# Patient Record
Sex: Female | Born: 1955
Health system: Southern US, Community
[De-identification: ages and names within clinical notes are randomized; demographics above are authoritative.]

---

## 1998-10-08 ENCOUNTER — Other Ambulatory Visit: Admission: RE | Admit: 1998-10-08 | Discharge: 1998-10-08 | Payer: Self-pay | Admitting: *Deleted

## 2000-11-23 ENCOUNTER — Other Ambulatory Visit: Admission: RE | Admit: 2000-11-23 | Discharge: 2000-11-23 | Payer: Self-pay | Admitting: *Deleted

## 2002-08-28 ENCOUNTER — Other Ambulatory Visit: Admission: RE | Admit: 2002-08-28 | Discharge: 2002-08-28 | Payer: Self-pay | Admitting: *Deleted

## 2002-11-27 ENCOUNTER — Other Ambulatory Visit: Admission: RE | Admit: 2002-11-27 | Discharge: 2002-11-27 | Payer: Self-pay | Admitting: *Deleted

## 2004-02-02 ENCOUNTER — Other Ambulatory Visit: Admission: RE | Admit: 2004-02-02 | Discharge: 2004-02-02 | Payer: Self-pay | Admitting: *Deleted

## 2005-10-24 ENCOUNTER — Other Ambulatory Visit: Admission: RE | Admit: 2005-10-24 | Discharge: 2005-10-24 | Payer: Self-pay | Admitting: *Deleted

## 2006-12-12 ENCOUNTER — Other Ambulatory Visit: Admission: RE | Admit: 2006-12-12 | Discharge: 2006-12-12 | Payer: Self-pay | Admitting: *Deleted

## 2008-03-24 ENCOUNTER — Other Ambulatory Visit: Admission: RE | Admit: 2008-03-24 | Discharge: 2008-03-24 | Payer: Self-pay | Admitting: Gynecology

## 2008-07-17 ENCOUNTER — Ambulatory Visit: Payer: Self-pay | Admitting: Gastroenterology

## 2008-07-31 ENCOUNTER — Ambulatory Visit: Payer: Self-pay | Admitting: Gastroenterology

## 2011-04-24 ENCOUNTER — Ambulatory Visit: Payer: Self-pay | Admitting: Internal Medicine

## 2011-05-03 ENCOUNTER — Encounter: Payer: Self-pay | Admitting: Internal Medicine

## 2011-05-03 ENCOUNTER — Ambulatory Visit (INDEPENDENT_AMBULATORY_CARE_PROVIDER_SITE_OTHER): Payer: BC Managed Care – PPO | Admitting: Internal Medicine

## 2011-05-03 VITALS — BP 118/78 | HR 52 | Temp 97.5°F | Resp 16 | Ht 68.0 in | Wt 118.4 lb

## 2011-05-03 DIAGNOSIS — Z0000 Encounter for general adult medical examination without abnormal findings: Secondary | ICD-10-CM

## 2011-05-03 NOTE — Assessment & Plan Note (Addendum)
Exam is normal, labs are not available to me today to review but she will have them forwarded to me, form completed for her insurance premium

## 2011-05-03 NOTE — Patient Instructions (Signed)
Osteoporosis Osteoporosis is a disease of the bones that makes them weaker and prone to break (fracture). By their mid-30s, most people begin to gradually lose bone strength. If this is severe enough, osteoporosis may occur. Osteopenia is a less severe weakness of the bones, which places you at risk for osteoporosis. It is important to identify if you have osteoporosis or osteopenia. Bone fractures from osteoporosis (especially hip and spine fractures) are a major cause of hospitalization, loss of independence, and can lead to life-threatening complications. CAUSES There are a number of causes and risk factors:  Gender. Women are at a higher risk for osteoporosis than men.   Age. Bone formation slows down with age.   Ethnicity. For unclear reasons, white and Asian women are at higher risk for osteoporosis. Hispanic and African American women are at increased, but lesser, risk.   Family history of osteoporosis can mean that you are at a higher risk for getting it.   History of bone fractures indicates you may be at higher risk of another.   Calcium is very important for bone health and strength. Not enough calcium in your diet increases your risk for osteoporosis. Vitamin D is important for calcium metabolism. You get vitamin D from sunlight, foods, or supplements.   Physical activity. Bones get stronger with weight-bearing exercise and weaker without use.   Smoking is associated with decreased bone strength.   Medicines. Cortisone medicines, too much thyroid medicine, some cancer and seizure medicines, and others can weaken bones and cause osteoporosis.   Decreased body weight is associated with osteoporosis. The small amount of estrogen-type molecules produced in fat cells seems to protect the bones.   Menopausal decrease in the hormone estrogen can cause osteoporosis.   Low levels of the hormone testosterone can cause osteoporosis.   Some medical conditions can lead to osteoporosis  (hyperthyroidism, hyperparathyroidism, B12 deficiency).  SYMPTOMS Usually, no symptoms are felt as the bones weaken. The first symptoms are generally related to bone fractures. You may have silent, tiny bone fractures, especially in your spine. This can cause height loss and forward bending of the spine (kyphosis). DIAGNOSIS You or your caregiver may suspect osteoporosis based on height loss and kyphosis. Osteoporosis or osteopenia may be identified on an X-ray done for other reasons. A bone density measurement will likely be taken. Your bones are often measured at your lower spine or your hips. Measurement is done by an X-ray called a DEXA scan, or sometimes by a computerized X-ray scan (CT or CAT scan). Other tests may be done to find the cause of osteoporosis, such as blood tests to measure calcium and vitamin D, or to monitor treatment. TREATMENT The goal of osteoporosis treatment is to prevent fractures. This is done through medicine and home care treatments. Treatment will slow the weakening of your bones and strengthen them where possible. Measures to decrease the likelihood of falling and fracturing a bone are also important. Medicine  You may need supplements if you are not getting enough calcium, vitamin D, and vitamin B12.   If you are female and menopausal, you should discuss the option of estrogen replacement or estrogen-like medicine with your caregiver.   Medicines can be taken by mouth or injection to help build bone strength. When taken by mouth, there are important directions that you need to follow.   Calcitonin is a hormone made by the thyroid gland that can help build bone strength and decrease fracture risk in the spine. It can be taken by  nasal spray or injection.   Parathyroid hormone can be injected to help build bone strength.   You will need to continue to get enough calcium intake with any of these medicines.  FALL PREVENTION  If you are unsteady on your feet, use a  cane, walker, or walk with someone's help.   Remove loose rugs or electrical cords from your home.   Keep your home well lit at night. Use glasses if you need them.   Avoid icy streets and wet or waxed floors.   Hold the railing when using stairs.   Watch out for your pets.   Install grab bars in your bathroom.   Exercise. Physical activity, especially weight-bearing exercise, helps strengthen bones. Strength and balance exercise, such as tai chi, helps prevent falls.   Alcohol and some medicines can make you more likely to fall. Discuss alcohol use with your caregiver. Ask your caregiver if any of your medicines might increase your risk for falling. Ask if safer alternatives are available.  HOME CARE INSTRUCTIONS  Try to prevent and avoid falls.   To pick up objects, bend at the knees. Do not bend with your back.   Do not smoke. If you smoke, ask for help to stop.   Have adequate calcium and vitamin D in your diet. Talk with your caregiver about amounts.   Before exercising, ask your caregiver what exercises will be good for you.   Only take over-the-counter or prescription medicines for pain, discomfort, or fever as directed by your caregiver.  SEEK MEDICAL CARE IF:  You have had a fracture and your pain is not controlled.   You have had a fracture and you are not able to return to activities as expected.   You are reinjured.   You develop side effects from medicines, especially stomach pain or trouble swallowing.   You develop new, unexplained problems.  SEEK IMMEDIATE MEDICAL CARE IF:  You develop sudden, severe pain in your back.   You develop pain after an injury or fall.  Document Released: 06/14/2005 Document Re-Released: 02/22/2010 [facility] Patient Information 2011 [place], [place].

## 2011-05-03 NOTE — Progress Notes (Signed)
  Subjective:    Patient ID: Leslie Cooper, female    DOB: [birth year], 55 y.o.   MRN: [id number]  HPI New to me for a physical BUT she tells me that she saw Dr. Lorenso Courier one month ago and had normal labs (incl FLP) and that her PAP and mammogram were normal.  Review of Systems  Constitutional: Negative.   HENT: Negative.   Eyes: Negative.   Respiratory: Negative.   Cardiovascular: Negative.   Gastrointestinal: Negative.   Genitourinary: Negative.   Musculoskeletal: Negative.   Skin: Negative.   Neurological: Negative.   Hematological: Negative.   Psychiatric/Behavioral: Negative.        Objective:   Physical Exam  Vitals reviewed. Constitutional: She is oriented to person, place, and time. She appears well-developed and well-nourished. No distress.  HENT:  Head: Normocephalic and atraumatic.  Right Ear: External ear normal.  Left Ear: External ear normal.  Nose: Nose normal.  Mouth/Throat: Oropharynx is clear and moist. No oropharyngeal exudate.  Eyes: Conjunctivae and EOM are normal. Pupils are equal, round, and reactive to light. Right eye exhibits no discharge. Left eye exhibits no discharge. No scleral icterus.  Neck: Normal range of motion. Neck supple. No JVD present. No tracheal deviation present. No thyromegaly present.  Cardiovascular: Normal rate, regular rhythm, normal heart sounds and intact distal pulses.  Exam reveals no gallop and no friction rub.   No murmur heard. Pulmonary/Chest: Effort normal and breath sounds normal. No stridor. No respiratory distress. She has no wheezes. She has no rales. She exhibits no tenderness.  Abdominal: Soft. Bowel sounds are normal. She exhibits no distension and no mass. There is no tenderness. There is no rebound and no guarding.  Musculoskeletal: Normal range of motion. She exhibits no edema and no tenderness.  Lymphadenopathy:    She has no cervical adenopathy.  Neurological: She is alert and oriented to person, place,  and time. She has normal reflexes.  Skin: Skin is warm and dry. No rash noted. She is not diaphoretic. No erythema. No pallor.  Psychiatric: She has a normal mood and affect. Her behavior is normal. Judgment and thought content normal.          Assessment & Plan:

## 2012-05-13 ENCOUNTER — Ambulatory Visit (INDEPENDENT_AMBULATORY_CARE_PROVIDER_SITE_OTHER): Payer: BC Managed Care – PPO | Admitting: Internal Medicine

## 2012-05-13 ENCOUNTER — Encounter: Payer: Self-pay | Admitting: Internal Medicine

## 2012-05-13 VITALS — BP 108/62 | HR 69 | Temp 97.8°F | Resp 16 | Wt 121.5 lb

## 2012-05-13 DIAGNOSIS — Z0000 Encounter for general adult medical examination without abnormal findings: Secondary | ICD-10-CM

## 2012-05-13 LAB — LIPID PANEL
Cholesterol, Total: 195
HDL: 87 mg/dL — AB (ref 35–70)
Triglycerides: 62
VLDL: 12 mg/dL

## 2012-05-13 LAB — COMPLETE METABOLIC PANEL WITH GFR
ALT: 20 U/L (ref 7–35)
AST: 23 U/L
Alkaline Phosphatase: 58 U/L
BUN/Creatinine Ratio: 16
Calcium: 9.6 mg/dL
Chloride: 104 mmol/L
Globulin: 1.8
Phosphorus: 4.2 mg/dL (ref 2.5–4.9)
Potassium: 4 mmol/L
Sodium: 142 mmol/L (ref 137–147)
Uric Acid: 4.7

## 2012-05-13 NOTE — Progress Notes (Signed)
  Subjective:    Patient ID: Leslie Cooper, female    DOB: Jan 18, 1956, 56 y.o.   MRN: [id number]  HPI  She returns for a physical, she feels well and offers no complaints. She saw her GYN doctor about 3 weeks ago.  Review of Systems  Constitutional: Negative.   HENT: Negative.   Eyes: Negative.   Respiratory: Negative.   Cardiovascular: Negative.   Gastrointestinal: Negative.   Genitourinary: Negative.   Musculoskeletal: Negative.   Skin: Negative.   Neurological: Negative.   Hematological: Negative.   Psychiatric/Behavioral: Negative.        Objective:   Physical Exam  Vitals reviewed. Constitutional: She is oriented to person, place, and time. She appears well-developed and well-nourished. No distress.  HENT:  Head: Normocephalic and atraumatic.  Mouth/Throat: Oropharynx is clear and moist. No oropharyngeal exudate.  Eyes: Conjunctivae are normal. Right eye exhibits no discharge. Left eye exhibits no discharge. No scleral icterus.  Neck: Normal range of motion. Neck supple. No JVD present. No tracheal deviation present. No thyromegaly present.  Cardiovascular: Normal rate, regular rhythm, normal heart sounds and intact distal pulses.  Exam reveals no gallop and no friction rub.   No murmur heard. Pulmonary/Chest: Effort normal and breath sounds normal. No stridor. No respiratory distress. She has no wheezes. She has no rales. She exhibits no tenderness.  Abdominal: Soft. Bowel sounds are normal. She exhibits no distension and no mass. There is no tenderness. There is no rebound and no guarding.  Musculoskeletal: Normal range of motion. She exhibits no edema and no tenderness.  Lymphadenopathy:    She has no cervical adenopathy.  Neurological: She is oriented to person, place, and time.  Skin: Skin is warm and dry. No rash noted. She is not diaphoretic. No erythema. No pallor.  Psychiatric: She has a normal mood and affect. Her behavior is normal. Judgment and thought  content normal.      No results found for this basename: WBC, HGB, HCT, PLT, GLUCOSE, CHOL, TRIG, HDL, LDLDIRECT, LDLCALC, ALT, AST, NA, K, CL, CREATININE, BUN, CO2, TSH, PSA, INR, GLUF, HGBA1C, MICROALBUR      Assessment & Plan:

## 2012-05-13 NOTE — Assessment & Plan Note (Signed)
Exam done, labs will be done in OCT by her employer, she was given pt ed material, form completed and faxed to her employer

## 2012-05-13 NOTE — Patient Instructions (Signed)
Preventive Care for Adults, Female A healthy lifestyle and preventive care can promote health and wellness. Preventive health guidelines for women include the following key practices.  A routine yearly physical is a good way to check with your caregiver about your health and preventive screening. It is a chance to share any concerns and updates on your health, and to receive a thorough exam.   Visit your dentist for a routine exam and preventive care every 6 months. Brush your teeth twice a day and floss once a day. Good oral hygiene prevents tooth decay and gum disease.   The frequency of eye exams is based on your age, health, family medical history, use of contact lenses, and other factors. Follow your caregiver's recommendations for frequency of eye exams.   Eat a healthy diet. Foods like vegetables, fruits, whole grains, low-fat dairy products, and lean protein foods contain the nutrients you need without too many calories. Decrease your intake of foods high in solid fats, added sugars, and salt. Eat the right amount of calories for you.Get information about a proper diet from your caregiver, if necessary.   Regular physical exercise is one of the most important things you can do for your health. Most adults should get at least 150 minutes of moderate-intensity exercise (any activity that increases your heart rate and causes you to sweat) each week. In addition, most adults need muscle-strengthening exercises on 2 or more days a week.   Maintain a healthy weight. The body mass index (BMI) is a screening tool to identify possible weight problems. It provides an estimate of body fat based on height and weight. Your caregiver can help determine your BMI, and can help you achieve or maintain a healthy weight.For adults 20 years and older:   A BMI below 18.5 is considered underweight.   A BMI of 18.5 to 24.9 is normal.   A BMI of 25 to 29.9 is considered overweight.   A BMI of 30 and above is  considered obese.   Maintain normal blood lipids and cholesterol levels by exercising and minimizing your intake of saturated fat. Eat a balanced diet with plenty of fruit and vegetables. Blood tests for lipids and cholesterol should begin at age 20 and be repeated every 5 years. If your lipid or cholesterol levels are high, you are over 50, or you are at high risk for heart disease, you may need your cholesterol levels checked more frequently.Ongoing high lipid and cholesterol levels should be treated with medicines if diet and exercise are not effective.   If you smoke, find out from your caregiver how to quit. If you do not use tobacco, do not start.   If you are pregnant, do not drink alcohol. If you are breastfeeding, be very cautious about drinking alcohol. If you are not pregnant and choose to drink alcohol, do not exceed 1 drink per day. One drink is considered to be 12 ounces (355 mL) of beer, 5 ounces (148 mL) of wine, or 1.5 ounces (44 mL) of liquor.   Avoid use of street drugs. Do not share needles with anyone. Ask for help if you need support or instructions about stopping the use of drugs.   High blood pressure causes heart disease and increases the risk of stroke. Your blood pressure should be checked at least every 1 to 2 years. Ongoing high blood pressure should be treated with medicines if weight loss and exercise are not effective.   If you are 55 to 57   years old, ask your caregiver if you should take aspirin to prevent strokes.   Diabetes screening involves taking a blood sample to check your fasting blood sugar level. This should be done once every 3 years, after age 45, if you are within normal weight and without risk factors for diabetes. Testing should be considered at a younger age or be carried out more frequently if you are overweight and have at least 1 risk factor for diabetes.   Breast cancer screening is essential preventive care for women. You should practice "breast  self-awareness." This means understanding the normal appearance and feel of your breasts and may include breast self-examination. Any changes detected, no matter how small, should be reported to a caregiver. Women in their 20s and 30s should have a clinical breast exam (CBE) by a caregiver as part of a regular health exam every 1 to 3 years. After age 40, women should have a CBE every year. Starting at age 40, women should consider having a mammography (breast X-ray test) every year. Women who have a family history of breast cancer should talk to their caregiver about genetic screening. Women at a high risk of breast cancer should talk to their caregivers about having magnetic resonance imaging (MRI) and a mammography every year.   The Pap test is a screening test for cervical cancer. A Pap test can show cell changes on the cervix that might become cervical cancer if left untreated. A Pap test is a procedure in which cells are obtained and examined from the lower end of the uterus (cervix).   Women should have a Pap test starting at age 21.   Between ages 21 and 29, Pap tests should be repeated every 2 years.   Beginning at age 30, you should have a Pap test every 3 years as long as the past 3 Pap tests have been normal.   Some women have medical problems that increase the chance of getting cervical cancer. Talk to your caregiver about these problems. It is especially important to talk to your caregiver if a new problem develops soon after your last Pap test. In these cases, your caregiver may recommend more frequent screening and Pap tests.   The above recommendations are the same for women who have or have not gotten the vaccine for human papillomavirus (HPV).   If you had a hysterectomy for a problem that was not cancer or a condition that could lead to cancer, then you no longer need Pap tests. Even if you no longer need a Pap test, a regular exam is a good idea to make sure no other problems are  starting.   If you are between ages 65 and 70, and you have had normal Pap tests going back 10 years, you no longer need Pap tests. Even if you no longer need a Pap test, a regular exam is a good idea to make sure no other problems are starting.   If you have had past treatment for cervical cancer or a condition that could lead to cancer, you need Pap tests and screening for cancer for at least 20 years after your treatment.   If Pap tests have been discontinued, risk factors (such as a new sexual partner) need to be reassessed to determine if screening should be resumed.   The HPV test is an additional test that may be used for cervical cancer screening. The HPV test looks for the virus that can cause the cell changes on the cervix.   The cells collected during the Pap test can be tested for HPV. The HPV test could be used to screen women aged 30 years and older, and should be used in women of any age who have unclear Pap test results. After the age of 30, women should have HPV testing at the same frequency as a Pap test.   Colorectal cancer can be detected and often prevented. Most routine colorectal cancer screening begins at the age of 50 and continues through age 75. However, your caregiver may recommend screening at an earlier age if you have risk factors for colon cancer. On a yearly basis, your caregiver may provide home test kits to check for hidden blood in the stool. Use of a small camera at the end of a tube, to directly examine the colon (sigmoidoscopy or colonoscopy), can detect the earliest forms of colorectal cancer. Talk to your caregiver about this at age 50, when routine screening begins. Direct examination of the colon should be repeated every 5 to 10 years through age 75, unless early forms of pre-cancerous polyps or small growths are found.   Hepatitis C blood testing is recommended for all people born from 1945 through 1965 and any individual with known risks for hepatitis C.    Practice safe sex. Use condoms and avoid high-risk sexual practices to reduce the spread of sexually transmitted infections (STIs). STIs include gonorrhea, chlamydia, syphilis, trichomonas, herpes, HPV, and human immunodeficiency virus (HIV). Herpes, HIV, and HPV are viral illnesses that have no cure. They can result in disability, cancer, and death. Sexually active women aged 25 and younger should be checked for chlamydia. Older women with new or multiple partners should also be tested for chlamydia. Testing for other STIs is recommended if you are sexually active and at increased risk.   Osteoporosis is a disease in which the bones lose minerals and strength with aging. This can result in serious bone fractures. The risk of osteoporosis can be identified using a bone density scan. Women ages 65 and over and women at risk for fractures or osteoporosis should discuss screening with their caregivers. Ask your caregiver whether you should take a calcium supplement or vitamin D to reduce the rate of osteoporosis.   Menopause can be associated with physical symptoms and risks. Hormone replacement therapy is available to decrease symptoms and risks. You should talk to your caregiver about whether hormone replacement therapy is right for you.   Use sunscreen with sun protection factor (SPF) of 30 or more. Apply sunscreen liberally and repeatedly throughout the day. You should seek shade when your shadow is shorter than you. Protect yourself by wearing long sleeves, pants, a wide-brimmed hat, and sunglasses year round, whenever you are outdoors.   Once a month, do a whole body skin exam, using a mirror to look at the skin on your back. Notify your caregiver of new moles, moles that have irregular borders, moles that are larger than a pencil eraser, or moles that have changed in shape or color.   Stay current with required immunizations.   Influenza. You need a dose every fall (or winter). The composition of  the flu vaccine changes each year, so being vaccinated once is not enough.   Pneumococcal polysaccharide. You need 1 to 2 doses if you smoke cigarettes or if you have certain chronic medical conditions. You need 1 dose at age 65 (or older) if you have never been vaccinated.   Tetanus, diphtheria, pertussis (Tdap, Td). Get 1 dose of   Tdap vaccine if you are younger than age 65, are over 65 and have contact with an infant, are a healthcare worker, are pregnant, or simply want to be protected from whooping cough. After that, you need a Td booster dose every 10 years. Consult your caregiver if you have not had at least 3 tetanus and diphtheria-containing shots sometime in your life or have a deep or dirty wound.   HPV. You need this vaccine if you are a woman age 26 or younger. The vaccine is given in 3 doses over 6 months.   Measles, mumps, rubella (MMR). You need at least 1 dose of MMR if you were born in 1957 or later. You may also need a second dose.   Meningococcal. If you are age 19 to 21 and a first-year college student living in a residence hall, or have one of several medical conditions, you need to get vaccinated against meningococcal disease. You may also need additional booster doses.   Zoster (shingles). If you are age 60 or older, you should get this vaccine.   Varicella (chickenpox). If you have never had chickenpox or you were vaccinated but received only 1 dose, talk to your caregiver to find out if you need this vaccine.   Hepatitis A. You need this vaccine if you have a specific risk factor for hepatitis A virus infection or you simply wish to be protected from this disease. The vaccine is usually given as 2 doses, 6 to 18 months apart.   Hepatitis B. You need this vaccine if you have a specific risk factor for hepatitis B virus infection or you simply wish to be protected from this disease. The vaccine is given in 3 doses, usually over 6 months.  Preventive Services /  Frequency Ages 19 to 39  Blood pressure check.** / Every 1 to 2 years.   Lipid and cholesterol check.** / Every 5 years beginning at age 20.   Clinical breast exam.** / Every 3 years for women in their 20s and 30s.   Pap test.** / Every 2 years from ages 21 through 29. Every 3 years starting at age 30 through age 65 or 70 with a history of 3 consecutive normal Pap tests.   HPV screening.** / Every 3 years from ages 30 through ages 65 to 70 with a history of 3 consecutive normal Pap tests.   Hepatitis C blood test.** / For any individual with known risks for hepatitis C.   Skin self-exam. / Monthly.   Influenza immunization.** / Every year.   Pneumococcal polysaccharide immunization.** / 1 to 2 doses if you smoke cigarettes or if you have certain chronic medical conditions.   Tetanus, diphtheria, pertussis (Tdap, Td) immunization. / A one-time dose of Tdap vaccine. After that, you need a Td booster dose every 10 years.   HPV immunization. / 3 doses over 6 months, if you are 26 and younger.   Measles, mumps, rubella (MMR) immunization. / You need at least 1 dose of MMR if you were born in 1957 or later. You may also need a second dose.   Meningococcal immunization. / 1 dose if you are age 19 to 21 and a first-year college student living in a residence hall, or have one of several medical conditions, you need to get vaccinated against meningococcal disease. You may also need additional booster doses.   Varicella immunization.** / Consult your caregiver.   Hepatitis A immunization.** / Consult your caregiver. 2 doses, 6 to 18 months   apart.   Hepatitis B immunization.** / Consult your caregiver. 3 doses usually over 6 months.  Ages 40 to 64  Blood pressure check.** / Every 1 to 2 years.   Lipid and cholesterol check.** / Every 5 years beginning at age 20.   Clinical breast exam.** / Every year after age 40.   Mammogram.** / Every year beginning at age 40 and continuing for as  long as you are in good health. Consult with your caregiver.   Pap test.** / Every 3 years starting at age 30 through age 65 or 70 with a history of 3 consecutive normal Pap tests.   HPV screening.** / Every 3 years from ages 30 through ages 65 to 70 with a history of 3 consecutive normal Pap tests.   Fecal occult blood test (FOBT) of stool. / Every year beginning at age 50 and continuing until age 75. You may not need to do this test if you get a colonoscopy every 10 years.   Flexible sigmoidoscopy or colonoscopy.** / Every 5 years for a flexible sigmoidoscopy or every 10 years for a colonoscopy beginning at age 50 and continuing until age 75.   Hepatitis C blood test.** / For all people born from 1945 through 1965 and any individual with known risks for hepatitis C.   Skin self-exam. / Monthly.   Influenza immunization.** / Every year.   Pneumococcal polysaccharide immunization.** / 1 to 2 doses if you smoke cigarettes or if you have certain chronic medical conditions.   Tetanus, diphtheria, pertussis (Tdap, Td) immunization.** / A one-time dose of Tdap vaccine. After that, you need a Td booster dose every 10 years.   Measles, mumps, rubella (MMR) immunization. / You need at least 1 dose of MMR if you were born in 1957 or later. You may also need a second dose.   Varicella immunization.** / Consult your caregiver.   Meningococcal immunization.** / Consult your caregiver.   Hepatitis A immunization.** / Consult your caregiver. 2 doses, 6 to 18 months apart.   Hepatitis B immunization.** / Consult your caregiver. 3 doses, usually over 6 months.  Ages 65 and over  Blood pressure check.** / Every 1 to 2 years.   Lipid and cholesterol check.** / Every 5 years beginning at age 20.   Clinical breast exam.** / Every year after age 40.   Mammogram.** / Every year beginning at age 40 and continuing for as long as you are in good health. Consult with your caregiver.   Pap test.** /  Every 3 years starting at age 30 through age 65 or 70 with a 3 consecutive normal Pap tests. Testing can be stopped between 65 and 70 with 3 consecutive normal Pap tests and no abnormal Pap or HPV tests in the past 10 years.   HPV screening.** / Every 3 years from ages 30 through ages 65 or 70 with a history of 3 consecutive normal Pap tests. Testing can be stopped between 65 and 70 with 3 consecutive normal Pap tests and no abnormal Pap or HPV tests in the past 10 years.   Fecal occult blood test (FOBT) of stool. / Every year beginning at age 50 and continuing until age 75. You may not need to do this test if you get a colonoscopy every 10 years.   Flexible sigmoidoscopy or colonoscopy.** / Every 5 years for a flexible sigmoidoscopy or every 10 years for a colonoscopy beginning at age 50 and continuing until age 75.   Hepatitis   C blood test.** / For all people born from 1945 through 1965 and any individual with known risks for hepatitis C.   Osteoporosis screening.** / A one-time screening for women ages 65 and over and women at risk for fractures or osteoporosis.   Skin self-exam. / Monthly.   Influenza immunization.** / Every year.   Pneumococcal polysaccharide immunization.** / 1 dose at age 65 (or older) if you have never been vaccinated.   Tetanus, diphtheria, pertussis (Tdap, Td) immunization. / A one-time dose of Tdap vaccine if you are over 65 and have contact with an infant, are a healthcare worker, or simply want to be protected from whooping cough. After that, you need a Td booster dose every 10 years.   Varicella immunization.** / Consult your caregiver.   Meningococcal immunization.** / Consult your caregiver.   Hepatitis A immunization.** / Consult your caregiver. 2 doses, 6 to 18 months apart.   Hepatitis B immunization.** / Check with your caregiver. 3 doses, usually over 6 months.  ** Family history and personal history of risk and conditions may change your caregiver's  recommendations. Document Released: 10/31/2001 Document Revised: 08/24/2011 Document Reviewed: 01/30/2011 [employer] Patient Information 2012 [employer]. 

## 2013-02-27 ENCOUNTER — Encounter: Payer: Self-pay | Admitting: Internal Medicine

## 2013-02-27 ENCOUNTER — Ambulatory Visit: Payer: BC Managed Care – PPO | Admitting: Internal Medicine

## 2013-02-27 ENCOUNTER — Ambulatory Visit (INDEPENDENT_AMBULATORY_CARE_PROVIDER_SITE_OTHER): Payer: BC Managed Care – PPO | Admitting: Internal Medicine

## 2013-02-27 ENCOUNTER — Other Ambulatory Visit: Payer: BC Managed Care – PPO

## 2013-02-27 VITALS — BP 100/74 | HR 61 | Temp 98.2°F | Resp 16 | Ht 68.0 in | Wt 117.5 lb

## 2013-02-27 DIAGNOSIS — Z20828 Contact with and (suspected) exposure to other viral communicable diseases: Secondary | ICD-10-CM

## 2013-02-27 DIAGNOSIS — Z205 Contact with and (suspected) exposure to viral hepatitis: Secondary | ICD-10-CM

## 2013-02-27 NOTE — Patient Instructions (Signed)
Preventive Care for Adults, Female A healthy lifestyle and preventive care can promote health and wellness. Preventive health guidelines for women include the following key practices.  A routine yearly physical is a good way to check with your caregiver about your health and preventive screening. It is a chance to share any concerns and updates on your health, and to receive a thorough exam.  Visit your dentist for a routine exam and preventive care every 6 months. Brush your teeth twice a day and floss once a day. Good oral hygiene prevents tooth decay and gum disease.  The frequency of eye exams is based on your age, health, family medical history, use of contact lenses, and other factors. Follow your caregiver's recommendations for frequency of eye exams.  Eat a healthy diet. Foods like vegetables, fruits, whole grains, low-fat dairy products, and lean protein foods contain the nutrients you need without too many calories. Decrease your intake of foods high in solid fats, added sugars, and salt. Eat the right amount of calories for you.Get information about a proper diet from your caregiver, if necessary.  Regular physical exercise is one of the most important things you can do for your health. Most adults should get at least 150 minutes of moderate-intensity exercise (any activity that increases your heart rate and causes you to sweat) each week. In addition, most adults need muscle-strengthening exercises on 2 or more days a week.  Maintain a healthy weight. The body mass index (BMI) is a screening tool to identify possible weight problems. It provides an estimate of body fat based on height and weight. Your caregiver can help determine your BMI, and can help you achieve or maintain a healthy weight.For adults 20 years and older:  A BMI below 18.5 is considered underweight.  A BMI of 18.5 to 24.9 is normal.  A BMI of 25 to 29.9 is considered overweight.  A BMI of 30 and above is  considered obese.  Maintain normal blood lipids and cholesterol levels by exercising and minimizing your intake of saturated fat. Eat a balanced diet with plenty of fruit and vegetables. Blood tests for lipids and cholesterol should begin at age 20 and be repeated every 5 years. If your lipid or cholesterol levels are high, you are over 50, or you are at high risk for heart disease, you may need your cholesterol levels checked more frequently.Ongoing high lipid and cholesterol levels should be treated with medicines if diet and exercise are not effective.  If you smoke, find out from your caregiver how to quit. If you do not use tobacco, do not start.  If you are pregnant, do not drink alcohol. If you are breastfeeding, be very cautious about drinking alcohol. If you are not pregnant and choose to drink alcohol, do not exceed 1 drink per day. One drink is considered to be 12 ounces (355 mL) of beer, 5 ounces (148 mL) of wine, or 1.5 ounces (44 mL) of liquor.  Avoid use of street drugs. Do not share needles with anyone. Ask for help if you need support or instructions about stopping the use of drugs.  High blood pressure causes heart disease and increases the risk of stroke. Your blood pressure should be checked at least every 1 to 2 years. Ongoing high blood pressure should be treated with medicines if weight loss and exercise are not effective.  If you are 55 to 58 years old, ask your caregiver if you should take aspirin to prevent strokes.  Diabetes   screening involves taking a blood sample to check your fasting blood sugar level. This should be done once every 3 years, after age 45, if you are within normal weight and without risk factors for diabetes. Testing should be considered at a younger age or be carried out more frequently if you are overweight and have at least 1 risk factor for diabetes.  Breast cancer screening is essential preventive care for women. You should practice "breast  self-awareness." This means understanding the normal appearance and feel of your breasts and may include breast self-examination. Any changes detected, no matter how small, should be reported to a caregiver. Women in their 20s and 30s should have a clinical breast exam (CBE) by a caregiver as part of a regular health exam every 1 to 3 years. After age 40, women should have a CBE every year. Starting at age 40, women should consider having a mammography (breast X-ray test) every year. Women who have a family history of breast cancer should talk to their caregiver about genetic screening. Women at a high risk of breast cancer should talk to their caregivers about having magnetic resonance imaging (MRI) and a mammography every year.  The Pap test is a screening test for cervical cancer. A Pap test can show cell changes on the cervix that might become cervical cancer if left untreated. A Pap test is a procedure in which cells are obtained and examined from the lower end of the uterus (cervix).  Women should have a Pap test starting at age 21.  Between ages 21 and 29, Pap tests should be repeated every 2 years.  Beginning at age 30, you should have a Pap test every 3 years as long as the past 3 Pap tests have been normal.  Some women have medical problems that increase the chance of getting cervical cancer. Talk to your caregiver about these problems. It is especially important to talk to your caregiver if a new problem develops soon after your last Pap test. In these cases, your caregiver may recommend more frequent screening and Pap tests.  The above recommendations are the same for women who have or have not gotten the vaccine for human papillomavirus (HPV).  If you had a hysterectomy for a problem that was not cancer or a condition that could lead to cancer, then you no longer need Pap tests. Even if you no longer need a Pap test, a regular exam is a good idea to make sure no other problems are  starting.  If you are between ages 65 and 70, and you have had normal Pap tests going back 10 years, you no longer need Pap tests. Even if you no longer need a Pap test, a regular exam is a good idea to make sure no other problems are starting.  If you have had past treatment for cervical cancer or a condition that could lead to cancer, you need Pap tests and screening for cancer for at least 20 years after your treatment.  If Pap tests have been discontinued, risk factors (such as a new sexual partner) need to be reassessed to determine if screening should be resumed.  The HPV test is an additional test that may be used for cervical cancer screening. The HPV test looks for the virus that can cause the cell changes on the cervix. The cells collected during the Pap test can be tested for HPV. The HPV test could be used to screen women aged 30 years and older, and should   be used in women of any age who have unclear Pap test results. After the age of 30, women should have HPV testing at the same frequency as a Pap test.  Colorectal cancer can be detected and often prevented. Most routine colorectal cancer screening begins at the age of 50 and continues through age 75. However, your caregiver may recommend screening at an earlier age if you have risk factors for colon cancer. On a yearly basis, your caregiver may provide home test kits to check for hidden blood in the stool. Use of a small camera at the end of a tube, to directly examine the colon (sigmoidoscopy or colonoscopy), can detect the earliest forms of colorectal cancer. Talk to your caregiver about this at age 50, when routine screening begins. Direct examination of the colon should be repeated every 5 to 10 years through age 75, unless early forms of pre-cancerous polyps or small growths are found.  Hepatitis C blood testing is recommended for all people born from 1945 through 1965 and any individual with known risks for hepatitis C.  Practice  safe sex. Use condoms and avoid high-risk sexual practices to reduce the spread of sexually transmitted infections (STIs). STIs include gonorrhea, chlamydia, syphilis, trichomonas, herpes, HPV, and human immunodeficiency virus (HIV). Herpes, HIV, and HPV are viral illnesses that have no cure. They can result in disability, cancer, and death. Sexually active women aged 25 and younger should be checked for chlamydia. Older women with new or multiple partners should also be tested for chlamydia. Testing for other STIs is recommended if you are sexually active and at increased risk.  Osteoporosis is a disease in which the bones lose minerals and strength with aging. This can result in serious bone fractures. The risk of osteoporosis can be identified using a bone density scan. Women ages 65 and over and women at risk for fractures or osteoporosis should discuss screening with their caregivers. Ask your caregiver whether you should take a calcium supplement or vitamin D to reduce the rate of osteoporosis.  Menopause can be associated with physical symptoms and risks. Hormone replacement therapy is available to decrease symptoms and risks. You should talk to your caregiver about whether hormone replacement therapy is right for you.  Use sunscreen with sun protection factor (SPF) of 30 or more. Apply sunscreen liberally and repeatedly throughout the day. You should seek shade when your shadow is shorter than you. Protect yourself by wearing long sleeves, pants, a wide-brimmed hat, and sunglasses year round, whenever you are outdoors.  Once a month, do a whole body skin exam, using a mirror to look at the skin on your back. Notify your caregiver of new moles, moles that have irregular borders, moles that are larger than a pencil eraser, or moles that have changed in shape or color.  Stay current with required immunizations.  Influenza. You need a dose every fall (or winter). The composition of the flu vaccine  changes each year, so being vaccinated once is not enough.  Pneumococcal polysaccharide. You need 1 to 2 doses if you smoke cigarettes or if you have certain chronic medical conditions. You need 1 dose at age 65 (or older) if you have never been vaccinated.  Tetanus, diphtheria, pertussis (Tdap, Td). Get 1 dose of Tdap vaccine if you are younger than age 65, are over 65 and have contact with an infant, are a healthcare worker, are pregnant, or simply want to be protected from whooping cough. After that, you need a Td   booster dose every 10 years. Consult your caregiver if you have not had at least 3 tetanus and diphtheria-containing shots sometime in your life or have a deep or dirty wound.  HPV. You need this vaccine if you are a woman age 26 or younger. The vaccine is given in 3 doses over 6 months.  Measles, mumps, rubella (MMR). You need at least 1 dose of MMR if you were born in 1957 or later. You may also need a second dose.  Meningococcal. If you are age 19 to 21 and a first-year college student living in a residence hall, or have one of several medical conditions, you need to get vaccinated against meningococcal disease. You may also need additional booster doses.  Zoster (shingles). If you are age 60 or older, you should get this vaccine.  Varicella (chickenpox). If you have never had chickenpox or you were vaccinated but received only 1 dose, talk to your caregiver to find out if you need this vaccine.  Hepatitis A. You need this vaccine if you have a specific risk factor for hepatitis A virus infection or you simply wish to be protected from this disease. The vaccine is usually given as 2 doses, 6 to 18 months apart.  Hepatitis B. You need this vaccine if you have a specific risk factor for hepatitis B virus infection or you simply wish to be protected from this disease. The vaccine is given in 3 doses, usually over 6 months. Preventive Services / Frequency Ages 19 to 39  Blood  pressure check.** / Every 1 to 2 years.  Lipid and cholesterol check.** / Every 5 years beginning at age 20.  Clinical breast exam.** / Every 3 years for women in their 20s and 30s.  Pap test.** / Every 2 years from ages 21 through 29. Every 3 years starting at age 30 through age 65 or 70 with a history of 3 consecutive normal Pap tests.  HPV screening.** / Every 3 years from ages 30 through ages 65 to 70 with a history of 3 consecutive normal Pap tests.  Hepatitis C blood test.** / For any individual with known risks for hepatitis C.  Skin self-exam. / Monthly.  Influenza immunization.** / Every year.  Pneumococcal polysaccharide immunization.** / 1 to 2 doses if you smoke cigarettes or if you have certain chronic medical conditions.  Tetanus, diphtheria, pertussis (Tdap, Td) immunization. / A one-time dose of Tdap vaccine. After that, you need a Td booster dose every 10 years.  HPV immunization. / 3 doses over 6 months, if you are 26 and younger.  Measles, mumps, rubella (MMR) immunization. / You need at least 1 dose of MMR if you were born in 1957 or later. You may also need a second dose.  Meningococcal immunization. / 1 dose if you are age 19 to 21 and a first-year college student living in a residence hall, or have one of several medical conditions, you need to get vaccinated against meningococcal disease. You may also need additional booster doses.  Varicella immunization.** / Consult your caregiver.  Hepatitis A immunization.** / Consult your caregiver. 2 doses, 6 to 18 months apart.  Hepatitis B immunization.** / Consult your caregiver. 3 doses usually over 6 months. Ages 40 to 64  Blood pressure check.** / Every 1 to 2 years.  Lipid and cholesterol check.** / Every 5 years beginning at age 20.  Clinical breast exam.** / Every year after age 40.  Mammogram.** / Every year beginning at age 40   and continuing for as long as you are in good health. Consult with your  caregiver.  Pap test.** / Every 3 years starting at age 30 through age 65 or 70 with a history of 3 consecutive normal Pap tests.  HPV screening.** / Every 3 years from ages 30 through ages 65 to 70 with a history of 3 consecutive normal Pap tests.  Fecal occult blood test (FOBT) of stool. / Every year beginning at age 50 and continuing until age 75. You may not need to do this test if you get a colonoscopy every 10 years.  Flexible sigmoidoscopy or colonoscopy.** / Every 5 years for a flexible sigmoidoscopy or every 10 years for a colonoscopy beginning at age 50 and continuing until age 75.  Hepatitis C blood test.** / For all people born from 1945 through 1965 and any individual with known risks for hepatitis C.  Skin self-exam. / Monthly.  Influenza immunization.** / Every year.  Pneumococcal polysaccharide immunization.** / 1 to 2 doses if you smoke cigarettes or if you have certain chronic medical conditions.  Tetanus, diphtheria, pertussis (Tdap, Td) immunization.** / A one-time dose of Tdap vaccine. After that, you need a Td booster dose every 10 years.  Measles, mumps, rubella (MMR) immunization. / You need at least 1 dose of MMR if you were born in 1957 or later. You may also need a second dose.  Varicella immunization.** / Consult your caregiver.  Meningococcal immunization.** / Consult your caregiver.  Hepatitis A immunization.** / Consult your caregiver. 2 doses, 6 to 18 months apart.  Hepatitis B immunization.** / Consult your caregiver. 3 doses, usually over 6 months. Ages 65 and over  Blood pressure check.** / Every 1 to 2 years.  Lipid and cholesterol check.** / Every 5 years beginning at age 20.  Clinical breast exam.** / Every year after age 40.  Mammogram.** / Every year beginning at age 40 and continuing for as long as you are in good health. Consult with your caregiver.  Pap test.** / Every 3 years starting at age 30 through age 65 or 70 with a 3  consecutive normal Pap tests. Testing can be stopped between 65 and 70 with 3 consecutive normal Pap tests and no abnormal Pap or HPV tests in the past 10 years.  HPV screening.** / Every 3 years from ages 30 through ages 65 or 70 with a history of 3 consecutive normal Pap tests. Testing can be stopped between 65 and 70 with 3 consecutive normal Pap tests and no abnormal Pap or HPV tests in the past 10 years.  Fecal occult blood test (FOBT) of stool. / Every year beginning at age 50 and continuing until age 75. You may not need to do this test if you get a colonoscopy every 10 years.  Flexible sigmoidoscopy or colonoscopy.** / Every 5 years for a flexible sigmoidoscopy or every 10 years for a colonoscopy beginning at age 50 and continuing until age 75.  Hepatitis C blood test.** / For all people born from 1945 through 1965 and any individual with known risks for hepatitis C.  Osteoporosis screening.** / A one-time screening for women ages 65 and over and women at risk for fractures or osteoporosis.  Skin self-exam. / Monthly.  Influenza immunization.** / Every year.  Pneumococcal polysaccharide immunization.** / 1 dose at age 65 (or older) if you have never been vaccinated.  Tetanus, diphtheria, pertussis (Tdap, Td) immunization. / A one-time dose of Tdap vaccine if you are over   65 and have contact with an infant, are a healthcare worker, or simply want to be protected from whooping cough. After that, you need a Td booster dose every 10 years.  Varicella immunization.** / Consult your caregiver.  Meningococcal immunization.** / Consult your caregiver.  Hepatitis A immunization.** / Consult your caregiver. 2 doses, 6 to 18 months apart.  Hepatitis B immunization.** / Check with your caregiver. 3 doses, usually over 6 months. ** Family history and personal history of risk and conditions may change your caregiver's recommendations. Document Released: 10/31/2001 Document Revised: 11/27/2011  Document Reviewed: 01/30/2011 [employer] Patient Information 2014 [employer].  

## 2013-02-27 NOTE — Assessment & Plan Note (Signed)
Check the Hep C ab today

## 2013-02-27 NOTE — Progress Notes (Signed)
  Subjective:    Patient ID: Leslie Cooper, female    DOB: October 26, 1955, 57 y.o.   MRN: [id number]  HPI  She comes in today to get tested for Hep C since her husband has been diagnosed. She feels well.  Review of Systems  Constitutional: Negative.  Negative for fever, chills, diaphoresis and fatigue.  HENT: Negative.   Eyes: Negative.   Respiratory: Negative for cough, chest tightness, shortness of breath, wheezing and stridor.   Gastrointestinal: Negative for nausea, vomiting, abdominal pain, diarrhea and constipation.  Endocrine: Negative.   Genitourinary: Negative.   Musculoskeletal: Negative.   Skin: Negative.   Neurological: Negative.  Negative for dizziness.  Hematological: Negative.  Negative for adenopathy. Does not bruise/bleed easily.  Psychiatric/Behavioral: Negative.        Objective:   Physical Exam  Vitals reviewed. Constitutional: She is oriented to person, place, and time. She appears well-developed and well-nourished.  Non-toxic appearance. She does not have a sickly appearance. She does not appear ill. No distress.  HENT:  Head: Normocephalic and atraumatic.  Mouth/Throat: Oropharynx is clear and moist. No oropharyngeal exudate.  Eyes: Conjunctivae are normal. Right eye exhibits no discharge. Left eye exhibits no discharge. No scleral icterus.  Neck: Normal range of motion. Neck supple. No JVD present. No tracheal deviation present. No thyromegaly present.  Cardiovascular: Normal rate, regular rhythm, normal heart sounds and intact distal pulses.  Exam reveals no gallop and no friction rub.   No murmur heard. Pulmonary/Chest: Effort normal and breath sounds normal. No stridor. No respiratory distress. She has no wheezes. She has no rales. She exhibits no tenderness.  Abdominal: Soft. Bowel sounds are normal. She exhibits no distension and no mass. There is no tenderness. There is no rebound and no guarding.  Musculoskeletal: Normal range of motion. She  exhibits no edema.  Lymphadenopathy:    She has no cervical adenopathy.  Neurological: She is oriented to person, place, and time.  Skin: Skin is warm and dry. No rash noted. She is not diaphoretic. No erythema. No pallor.  Psychiatric: She has a normal mood and affect. Her behavior is normal. Judgment and thought content normal.          Assessment & Plan:

## 2013-07-24 ENCOUNTER — Other Ambulatory Visit: Payer: Self-pay

## 2015-03-02 ENCOUNTER — Ambulatory Visit (INDEPENDENT_AMBULATORY_CARE_PROVIDER_SITE_OTHER): Payer: BLUE CROSS/BLUE SHIELD | Admitting: Internal Medicine

## 2015-03-02 ENCOUNTER — Encounter: Payer: Self-pay | Admitting: Internal Medicine

## 2015-03-02 VITALS — BP 120/78 | HR 60 | Temp 97.9°F | Resp 16 | Wt 118.0 lb

## 2015-03-02 DIAGNOSIS — L237 Allergic contact dermatitis due to plants, except food: Secondary | ICD-10-CM | POA: Diagnosis not present

## 2015-03-02 MED ORDER — PREDNISONE 20 MG PO TABS: 20.0000 mg | ORAL_TABLET | Freq: Two times a day (BID) | ORAL | Status: DC

## 2015-03-02 MED ORDER — MOMETASONE FUROATE 0.1 % EX OINT: TOPICAL_OINTMENT | Freq: Two times a day (BID) | CUTANEOUS | Status: DC

## 2015-03-02 NOTE — Progress Notes (Signed)
Pre visit review using our clinic review tool, if applicable. No additional management support is needed unless otherwise documented below in the visit note. 

## 2015-03-02 NOTE — Patient Instructions (Signed)
Apply  Generic Elocon twice a day to the rash as needed;NOT to face  If you could possibly have been exposed to poison ivy or oak ; immediately shower & use soap liberally to remove oil

## 2015-03-02 NOTE — Progress Notes (Signed)
   Subjective:    Patient ID: [name], female    DOB: 1956/07/10, 59 y.o.   MRN: [id number]  HPI  Her symptoms began 6/11 approximately 1 hour after working in the yard of a rental house. By that evening she began to have redness and blistering over the forearms. She used Technu without significant benefit.  Review of Systems  No associated itchy, watery eyes.  Swelling of the lips or tongue denied.  Shortness of breath, wheezing, or cough absent.  No urticaria noted.  Fever ,chills , or sweats denied. Purulence absent.  Diarrhea not present.      Objective:   Physical Exam  General appearance: Thin but adequately nourished; no acute distress or increased work of breathing is present.    Lymphatic: No  lymphadenopathy about the head, neck, or axilla .  Eyes: No conjunctival inflammation or lid edema is present. There is no scleral icterus.  Ears:  External ear exam shows no significant lesions or deformities.    Nose:  External nasal examination shows no deformity or inflammation. Nasal mucosa are pink and moist without lesions or exudates No septal dislocation or deviation.No obstruction to airflow.   Oral exam: Dental hygiene is good; lips and gums are healthy appearing.There is no oropharyngeal erythema or exudate .  Neck:  No deformities, thyromegaly, masses, or tenderness noted.   Supple with full range of motion without pain.   Heart:  Normal rate and regular rhythm. S1 and S2 normal without gallop, murmur, click, rub or other extra sounds.   Lungs:Chest clear to auscultation; no wheezes, rhonchi,rales ,or rubs present.  Extremities:  No cyanosis, edema, or clubbing  noted    Skin: Warm & dry w/o tenting or jaundice. Erythema and vesicle formation over the forearms. No associated pustules or cellulitis..       Assessment & Plan:  #1 Rhus dermatitis  See orders

## 2018-06-04 ENCOUNTER — Ambulatory Visit: Payer: Self-pay

## 2018-06-04 ENCOUNTER — Encounter: Payer: Self-pay | Admitting: Family Medicine

## 2018-06-04 ENCOUNTER — Ambulatory Visit (INDEPENDENT_AMBULATORY_CARE_PROVIDER_SITE_OTHER): Payer: BLUE CROSS/BLUE SHIELD

## 2018-06-04 ENCOUNTER — Ambulatory Visit: Payer: BLUE CROSS/BLUE SHIELD | Admitting: Family Medicine

## 2018-06-04 VITALS — BP 120/68 | HR 74 | Ht 68.0 in | Wt 113.6 lb

## 2018-06-04 DIAGNOSIS — M79672 Pain in left foot: Secondary | ICD-10-CM | POA: Diagnosis not present

## 2018-06-04 NOTE — Progress Notes (Signed)
Leslie Cooper Mcquillen - 62 y.o. female MRN [id number]  Date of birth: 06/14/1956  SUBJECTIVE:  Including CC & ROS.  Chief Complaint  Patient presents with  . New Patient (Initial Visit)    Cooper ankle sprain    Leslie Cooper Herrod is a 62 y.o. female that is presenting w/ c/o Cooper ankle pain after twisting her ankle on Sat evening.  She reports stepping on the lip/edge of the sidewalk and rolled her Cooper ankle and foot into inversion.  Pt does not recall hearing a "pop" or "snap" at the time of injury.  She also denies any immediate swelling but does report noticing swelling by Sat evening.  She states that the majority of her pain is localized to her Cooper distal, lateral foot.  She rates her pain as a 2/10 at rest and a 6/10 at it's worst.  She notes some tingling in her Cooper lateral foot.  Pt has been applying ice and taking IBU and Tylenol prn.    Review of Systems  Constitutional: Negative for fever.  HENT: Negative for congestion.   Respiratory: Negative for cough.   Cardiovascular: Negative for chest pain.  Gastrointestinal: Negative for abdominal pain.  Musculoskeletal: Positive for gait problem.  Skin: Negative for color change.  Neurological: Negative for weakness.  Hematological: Negative for adenopathy.  Psychiatric/Behavioral: Negative for agitation.    HISTORY: Past Medical, Surgical, Social, and Family History Reviewed & Updated per EMR.   Pertinent Historical Findings include:  Past Medical History:  Diagnosis Date  . Osteoporosis 2012    No past surgical history on file.  Allergies  Allergen Reactions  . Thimerosal     REACTION: Eye reaction; also no flu shots due to thimersol preservative    Family History  Problem Relation Age of Onset  . Heart disease Neg Hx   . Hypertension Neg Hx   . Cancer Neg Hx      Social History   Socioeconomic History  . Marital status: Married    Spouse name: Not on file  . Number of children: Not on file  . Years of education: Not on  file  . Highest education level: Not on file  Occupational History  . Not on file  Social Needs  . Financial resource strain: Not on file  . Food insecurity:    Worry: Not on file    Inability: Not on file  . Transportation needs:    Medical: Not on file    Non-medical: Not on file  Tobacco Use  . Smoking status: Never Smoker  . Smokeless tobacco: Never Used  Substance and Sexual Activity  . Alcohol use: Yes    Alcohol/week: 1.0 standard drinks    Types: 1 Glasses of wine per week  . Drug use: No  . Sexual activity: Yes    Birth control/protection: Post-menopausal  Lifestyle  . Physical activity:    Days per week: Not on file    Minutes per session: Not on file  . Stress: Not on file  Relationships  . Social connections:    Talks on phone: Not on file    Gets together: Not on file    Attends religious service: Not on file    Active member of club or organization: Not on file    Attends meetings of clubs or organizations: Not on file    Relationship status: Not on file  . Intimate partner violence:    Fear of current or ex partner: Not on file  Emotionally abused: Not on file    Physically abused: Not on file    Forced sexual activity: Not on file  Other Topics Concern  . Not on file  Social History Narrative   Caffienated drinks-yes   Seat belt use often-yes   Regular Exercise-yes   Smoke alarm in the home-yes   Firearms/guns in the home-yes   History of physical abuse-no                 PHYSICAL EXAM:  VS: BP 120/68 (BP Location: Left Arm, Patient Position: Sitting, Cuff Size: Normal)   Pulse 74   Ht 5\' 8"  (1.727 m)   Wt 113 lb 9.6 oz (51.5 kg)   SpO2 96%   BMI 17.27 kg/m  Physical Exam Gen: NAD, alert, cooperative with exam, well-appearing ENT: normal lips, normal nasal mucosa,  Eye: normal EOM, normal conjunctiva and lids CV:  no edema, +2 pedal pulses   Resp: no accessory muscle use, non-labored,  Skin: no rashes, no areas of induration    Neuro: normal tone, normal sensation to touch Psych:  normal insight, alert and oriented MSK:  Left foot: Ecchymosis and swelling over the lateral midfoot. Tenderness to palpation of the base of fifth metatarsal. No tenderness to palpation over the lateral malleolus, medial malleolus, navicular. Walking with a limp. Neurovascular intact  Limited ultrasound: Left foot:  Appears that she may have a fracture at the base of the fifth metatarsal to suggest a Jones fracture. Normal insertion into the base of the fifth metatarsal  Summary: Findings suggestive of fracture at the base the fifth metatarsal.  Ultrasound and interpretation by Clare Gandy, MD      ASSESSMENT & PLAN:   Left foot pain Independent review of the x-ray reveals a fracture of the base the fifth metatarsal. -We will make nonweightbearing and placed in a cam walker. -Follow-up in 1 week to x-ray.

## 2018-06-04 NOTE — Patient Instructions (Addendum)
Nice to meet you  Please try the ice  Please have your vitamin D checked  Please use the crutches and a boot  Please see us back in one week.

## 2018-06-05 DIAGNOSIS — S92353A Displaced fracture of fifth metatarsal bone, unspecified foot, initial encounter for closed fracture: Secondary | ICD-10-CM | POA: Insufficient documentation

## 2018-06-05 NOTE — Assessment & Plan Note (Signed)
Independent review of the x-ray reveals a fracture of the base the fifth metatarsal. -We will make nonweightbearing and placed in a cam walker. -Follow-up in 1 week to x-ray.

## 2018-06-11 ENCOUNTER — Encounter: Payer: Self-pay | Admitting: Family Medicine

## 2018-06-11 ENCOUNTER — Ambulatory Visit (INDEPENDENT_AMBULATORY_CARE_PROVIDER_SITE_OTHER): Payer: BLUE CROSS/BLUE SHIELD

## 2018-06-11 ENCOUNTER — Ambulatory Visit: Payer: BLUE CROSS/BLUE SHIELD | Admitting: Family Medicine

## 2018-06-11 VITALS — Ht 68.0 in

## 2018-06-11 DIAGNOSIS — S92352A Displaced fracture of fifth metatarsal bone, left foot, initial encounter for closed fracture: Secondary | ICD-10-CM

## 2018-06-11 NOTE — Progress Notes (Signed)
Leslie Cooper - 62 y.o. female MRN [id number]  Date of birth: 06/02/1956  SUBJECTIVE:  Including CC & ROS.  Chief Complaint  Patient presents with  . Follow-up    Leslie Cooper is a 62 y.o. female that is here today for left foot pain follow up. She has been wearing the Cam walker daily and using crutches. Denies pain.    Independent review of the x-ray from 9/17 shows a fracture of the base of the fifth metatarsal.  Review of Systems  Constitutional: Negative for fever.  HENT: Negative for congestion.   Respiratory: Negative for cough.   Cardiovascular: Negative for chest pain.  Gastrointestinal: Negative for abdominal pain.  Musculoskeletal: Positive for gait problem.  Skin: Negative for color change.  Neurological: Negative for weakness.  Hematological: Negative for adenopathy.  Psychiatric/Behavioral: Negative for agitation.    HISTORY: Past Medical, Surgical, Social, and Family History Reviewed & Updated per EMR.   Pertinent Historical Findings include:  Past Medical History:  Diagnosis Date  . Osteoporosis 2012    No past surgical history on file.  Allergies  Allergen Reactions  . Thimerosal     REACTION: Eye reaction; also no flu shots due to thimersol preservative    Family History  Problem Relation Age of Onset  . Heart disease Neg Hx   . Hypertension Neg Hx   . Cancer Neg Hx      Social History   Socioeconomic History  . Marital status: Married    Spouse name: Not on file  . Number of children: Not on file  . Years of education: Not on file  . Highest education level: Not on file  Occupational History  . Not on file  Social Needs  . Financial resource strain: Not on file  . Food insecurity:    Worry: Not on file    Inability: Not on file  . Transportation needs:    Medical: Not on file    Non-medical: Not on file  Tobacco Use  . Smoking status: Never Smoker  . Smokeless tobacco: Never Used  Substance and Sexual Activity  .  Alcohol use: Yes    Alcohol/week: 1.0 standard drinks    Types: 1 Glasses of wine per week  . Drug use: No  . Sexual activity: Yes    Birth control/protection: Post-menopausal  Lifestyle  . Physical activity:    Days per week: Not on file    Minutes per session: Not on file  . Stress: Not on file  Relationships  . Social connections:    Talks on phone: Not on file    Gets together: Not on file    Attends religious service: Not on file    Active member of club or organization: Not on file    Attends meetings of clubs or organizations: Not on file    Relationship status: Not on file  . Intimate partner violence:    Fear of current or ex partner: Not on file    Emotionally abused: Not on file    Physically abused: Not on file    Forced sexual activity: Not on file  Other Topics Concern  . Not on file  Social History Narrative   Caffienated drinks-yes   Seat belt use often-yes   Regular Exercise-yes   Smoke alarm in the home-yes   Firearms/guns in the home-yes   History of physical abuse-no                 PHYSICAL  EXAM:  VS: Ht 5\' 8"  (1.727 m)   BMI 17.27 kg/m  Physical Exam Gen: NAD, alert, cooperative with exam, well-appearing ENT: normal lips, normal nasal mucosa,  Eye: normal EOM, normal conjunctiva and lids CV:  no edema, +2 pedal pulses   Resp: no accessory muscle use, non-labored,   Skin: no rashes, no areas of induration  Neuro: normal tone, normal sensation to touch Psych:  normal insight, alert and oriented MSK:  Left foot: Mild ecchymosis and swelling. Some tenderness palpation of the base of the fifth metatarsal. Neurovascular intact     ASSESSMENT & PLAN:   Closed fracture of base of fifth metatarsal bone Fracture is nondisplaced on x-ray today.  We will continue the cam walker and crutches.  She will follow-up in 3 weeks to reimage at that time.  She has follow-up with her gynecologist to have vitamin D checked.  Discussed and counseled on  Prolia today.  She is supposed to have a bone scan set up in the near future.  She will let us know if she would like to pursue Prolia with [place].

## 2018-06-11 NOTE — Patient Instructions (Signed)
Good to see you  Please continue to use the Cam Walker and the crutches  See me back in 3 weeks.

## 2018-06-11 NOTE — Assessment & Plan Note (Signed)
Fracture is nondisplaced on x-ray today.  We will continue the cam walker and crutches.  She will follow-up in 3 weeks to reimage at that time.  She has follow-up with her gynecologist to have vitamin D checked.  Discussed and counseled on Prolia today.  She is supposed to have a bone scan set up in the near future.  She will let us know if she would like to pursue Prolia with us.

## 2018-07-02 ENCOUNTER — Encounter: Payer: Self-pay | Admitting: Family Medicine

## 2018-07-02 ENCOUNTER — Ambulatory Visit (INDEPENDENT_AMBULATORY_CARE_PROVIDER_SITE_OTHER): Payer: BLUE CROSS/BLUE SHIELD

## 2018-07-02 ENCOUNTER — Ambulatory Visit: Payer: BLUE CROSS/BLUE SHIELD | Admitting: Family Medicine

## 2018-07-02 VITALS — BP 128/66 | HR 68 | Ht 68.0 in

## 2018-07-02 DIAGNOSIS — S92352A Displaced fracture of fifth metatarsal bone, left foot, initial encounter for closed fracture: Secondary | ICD-10-CM

## 2018-07-02 NOTE — Patient Instructions (Signed)
Good to see you  Please continue the aircast  I will call you with the results from today  Please see me back in 3-4 weeks.

## 2018-07-02 NOTE — Progress Notes (Signed)
Leslie Cooper - 62 y.o. female MRN [id number]  Date of birth: [birth year]  SUBJECTIVE:  Including CC & ROS.  Chief Complaint  Patient presents with  . Follow-up    Leslie Cooper is a 62 y.o. female that is here today for left base of her 5th MT fracture. She has been wearing CAM walker and using crutches daily. She has not been taking anything for the pain. Pain is in her fifth metatarsal when she is weightbearing. She has been keeping her foot elevated at night    The above documentation has been reviewed and is accurate and complete. Clare Gandy, MD 07/03/2018, 4:18 PM>   Review of Systems  Constitutional: Negative for fever.  HENT: Negative for congestion.   Respiratory: Negative for cough.   Cardiovascular: Negative for chest pain.  Gastrointestinal: Negative for abdominal pain.  Musculoskeletal: Positive for gait problem.  Skin: Negative for color change.  Neurological: Negative for weakness.  Hematological: Negative for adenopathy.  Psychiatric/Behavioral: Negative for agitation.    HISTORY: Past Medical, Surgical, Social, and Family History Reviewed & Updated per EMR.   Pertinent Historical Findings include:  Past Medical History:  Diagnosis Date  . Osteoporosis 2012    No past surgical history on file.  Allergies  Allergen Reactions  . Thimerosal     REACTION: Eye reaction; also no flu shots due to thimersol preservative    Family History  Problem Relation Age of Onset  . Heart disease Neg Hx   . Hypertension Neg Hx   . Cancer Neg Hx      Social History   Socioeconomic History  . Marital status: Married    Spouse name: Not on file  . Number of children: Not on file  . Years of education: Not on file  . Highest education level: Not on file  Occupational History  . Not on file  Social Needs  . Financial resource strain: Not on file  . Food insecurity:    Worry: Not on file    Inability: Not on file  . Transportation needs:   Medical: Not on file    Non-medical: Not on file  Tobacco Use  . Smoking status: Never Smoker  . Smokeless tobacco: Never Used  Substance and Sexual Activity  . Alcohol use: Yes    Alcohol/week: 1.0 standard drinks    Types: 1 Glasses of wine per week  . Drug use: No  . Sexual activity: Yes    Birth control/protection: Post-menopausal  Lifestyle  . Physical activity:    Days per week: Not on file    Minutes per session: Not on file  . Stress: Not on file  Relationships  . Social connections:    Talks on phone: Not on file    Gets together: Not on file    Attends religious service: Not on file    Active member of club or organization: Not on file    Attends meetings of clubs or organizations: Not on file    Relationship status: Not on file  . Intimate partner violence:    Fear of current or ex partner: Not on file    Emotionally abused: Not on file    Physically abused: Not on file    Forced sexual activity: Not on file  Other Topics Concern  . Not on file  Social History Narrative   Caffienated drinks-yes   Seat belt use often-yes   Regular Exercise-yes   Smoke alarm in the home-yes   Firearms/guns  in the home-yes   History of physical abuse-no                 PHYSICAL EXAM:  VS: BP 128/66   Pulse 68   Ht 5\' 8"  (1.727 m)   SpO2 98%   BMI 17.27 kg/m  Physical Exam Gen: NAD, alert, cooperative with exam, well-appearing ENT: normal lips, normal nasal mucosa,  Eye: normal EOM, normal conjunctiva and lids CV:  no edema, +2 pedal pulses   Resp: no accessory muscle use, non-labored,   Skin: no rashes, no areas of induration  Neuro: normal tone, normal sensation to touch Psych:  normal insight, alert and oriented MSK:  Left foot:  Swelling over the dorsal component of the lateral forefoot  TTP over the base of the 5th MT  Unable to bear weight on left foot without pain  neurovascularly intact    Limited ultrasound: Left foot:  Fracture still apparent  with no callus formation at the base of the fifth metatarsal.  Summary: Healing base of the fifth metatarsal fracture.  Ultrasound and interpretation by Clare Gandy, MD    ASSESSMENT & PLAN:   Closed fracture of base of fifth metatarsal bone She is roughly 4-1/2 weeks out from her initial injury.  Has been using the cam walker.  Ultrasound did not demonstrate any callus formation today. -Continue the crutches and Cam walker. -Check vitamin D. -Follow-up in 3 to 4 weeks.  Would x-ray at that time

## 2018-07-03 LAB — VITAMIN D 25 HYDROXY (VIT D DEFICIENCY, FRACTURES): VITD: 74.85 ng/mL (ref 30.00–100.00)

## 2018-07-03 NOTE — Assessment & Plan Note (Signed)
She is roughly 4-1/2 weeks out from her initial injury.  Has been using the cam walker.  Ultrasound did not demonstrate any callus formation today. -Continue the crutches and Cam walker. -Check vitamin D. -Follow-up in 3 to 4 weeks.  Would x-ray at that time

## 2018-07-04 ENCOUNTER — Telehealth: Payer: Self-pay | Admitting: Family Medicine

## 2018-07-04 NOTE — Telephone Encounter (Signed)
Left VM for patient. If she calls back please have her speak with a nurse/CMA and inform that her vitamin D was normal. The [facility] can report results to patient.   If any questions then please take the best time and phone number to call and I will try to call her back.   Myra Rude, MD [facility] 07/04/2018, 8:59 AM

## 2018-07-05 NOTE — Telephone Encounter (Signed)
Patient called and given results per notes Dr. Jordan Likes below on 07/04/18, patient verbalized understanding.

## 2018-07-30 ENCOUNTER — Ambulatory Visit (INDEPENDENT_AMBULATORY_CARE_PROVIDER_SITE_OTHER): Payer: BLUE CROSS/BLUE SHIELD

## 2018-07-30 ENCOUNTER — Encounter: Payer: Self-pay | Admitting: Family Medicine

## 2018-07-30 ENCOUNTER — Ambulatory Visit: Payer: BLUE CROSS/BLUE SHIELD | Admitting: Family Medicine

## 2018-07-30 VITALS — BP 120/80 | HR 67 | Temp 98.0°F | Ht 68.0 in | Wt 114.0 lb

## 2018-07-30 DIAGNOSIS — S92352A Displaced fracture of fifth metatarsal bone, left foot, initial encounter for closed fracture: Secondary | ICD-10-CM

## 2018-07-30 NOTE — Progress Notes (Signed)
Leslie Cooper - 62 y.o. female MRN [id number]  Date of birth: 04/05/1956  SUBJECTIVE:  Including CC & ROS.  Chief Complaint  Patient presents with  . Foot Pain    pt c/o of top left pain , foot still swollen . she has not taken anything for pain   . Abdominal Pain    Leslie PerfectChristine L Cooper is a 62 y.o. female that is following up for fracture of her base of the fifth metatarsal.  She has been using crutches and a cam walker.  She has some pain over the dorsal midfoot region.  Denies any falling or trips.  Has some swelling over the base of the fifth metatarsal.  Has not had to take anything for pain.   Review of Systems  Constitutional: Negative for fever.  HENT: Negative for congestion.   Respiratory: Negative for cough.   Cardiovascular: Negative for chest pain.  Gastrointestinal: Negative for abdominal pain.  Musculoskeletal: Positive for gait problem.  Skin: Negative for color change.  Neurological: Negative for weakness.  Hematological: Negative for adenopathy.  Psychiatric/Behavioral: Negative for agitation.    HISTORY: Past Medical, Surgical, Social, and Family History Reviewed & Updated per EMR.   Pertinent Historical Findings include:  Past Medical History:  Diagnosis Date  . Osteoporosis 2012    No past surgical history on file.  Allergies  Allergen Reactions  . Thimerosal     REACTION: Eye reaction; also no flu shots due to thimersol preservative    Family History  Problem Relation Age of Onset  . Heart disease Neg Hx   . Hypertension Neg Hx   . Cancer Neg Hx      Social History   Socioeconomic History  . Marital status: Married    Spouse name: Not on file  . Number of children: Not on file  . Years of education: Not on file  . Highest education level: Not on file  Occupational History  . Not on file  Social Needs  . Financial resource strain: Not on file  . Food insecurity:    Worry: Not on file    Inability: Not on file  .  Transportation needs:    Medical: Not on file    Non-medical: Not on file  Tobacco Use  . Smoking status: Never Smoker  . Smokeless tobacco: Never Used  Substance and Sexual Activity  . Alcohol use: Yes    Alcohol/week: 1.0 standard drinks    Types: 1 Glasses of wine per week  . Drug use: No  . Sexual activity: Yes    Birth control/protection: Post-menopausal  Lifestyle  . Physical activity:    Days per week: Not on file    Minutes per session: Not on file  . Stress: Not on file  Relationships  . Social connections:    Talks on phone: Not on file    Gets together: Not on file    Attends religious service: Not on file    Active member of club or organization: Not on file    Attends meetings of clubs or organizations: Not on file    Relationship status: Not on file  . Intimate partner violence:    Fear of current or ex partner: Not on file    Emotionally abused: Not on file    Physically abused: Not on file    Forced sexual activity: Not on file  Other Topics Concern  . Not on file  Social History Narrative   Caffienated drinks-yes  Seat belt use often-yes   Regular Exercise-yes   Smoke alarm in the home-yes   Firearms/guns in the home-yes   History of physical abuse-no                 PHYSICAL EXAM:  VS: BP 120/80 (BP Location: Left Arm, Patient Position: Sitting, Cuff Size: Normal)   Pulse 67   Temp 98 F (36.7 C) (Oral)   Ht 5\' 8"  (1.727 m)   Wt 114 lb (51.7 kg)   SpO2 96%   BMI 17.33 kg/m  Physical Exam Gen: NAD, alert, cooperative with exam, well-appearing ENT: normal lips, normal nasal mucosa,  Eye: normal EOM, normal conjunctiva and lids CV:  no edema, +2 pedal pulses   Resp: no accessory muscle use, non-labored,  Skin: no rashes, no areas of induration  Neuro: normal tone, normal sensation to touch Psych:  normal insight, alert and oriented MSK:  Left foot: Some tenderness to palpation over the base of the fifth metatarsal. Some swelling in  this area. Normal range of motion. Normal strength resistance. Neurovascular intact.  Limited ultrasound: Left foot:  Fracture still appreciated at the base of the fifth metatarsal appearing there does appear to be some callus formation on the superior aspect.  Summary: Healing healing of the fifth metatarsal fracture  Ultrasound and interpretation by Clare Gandy, MD      ASSESSMENT & PLAN:   Closed fracture of base of fifth metatarsal bone She was able to walk up and down the hallway with limited pain today and without the use of the crutches.  There does demonstrate some mild healing on ultrasound today.  She is roughly 8 weeks with the use of the cam walker -X-ray. -She will try walking without any crutches and continue the cam walker. -Counseled on some range of motion exercises. -We will follow-up in 4 weeks.  At that time would remove her from the cam walker or consider having to send her for surgery.

## 2018-07-30 NOTE — Patient Instructions (Signed)
Good to see you  Please continue the boot  Please see me back in 4 weeks

## 2018-08-01 ENCOUNTER — Telehealth: Payer: Self-pay | Admitting: Family Medicine

## 2018-08-01 NOTE — Telephone Encounter (Signed)
Left VM for patient. If she calls back please have her speak with a nurse/CMA and inform that her xray does demonstrate some healing. The [facility] can report results to patient.   If any questions then please take the best time and phone number to call and I will try to call her back.   Myra RudeSchmitz, Shulem Mader E, MD [facility] 08/01/2018, 10:18 AM

## 2018-08-01 NOTE — Assessment & Plan Note (Signed)
She was able to walk up and down the hallway with limited pain today and without the use of the crutches.  There does demonstrate some mild healing on ultrasound today.  She is roughly 8 weeks with the use of the cam walker -X-ray. -She will try walking without any crutches and continue the cam walker. -Counseled on some range of motion exercises. -We will follow-up in 4 weeks.  At that time would remove her from the cam walker or consider having to send her for surgery.

## 2018-08-06 NOTE — Telephone Encounter (Signed)
Patient returned call and notified of provider message.

## 2018-08-27 ENCOUNTER — Encounter: Payer: Self-pay | Admitting: Family Medicine

## 2018-08-27 ENCOUNTER — Ambulatory Visit: Payer: BLUE CROSS/BLUE SHIELD | Admitting: Family Medicine

## 2018-08-27 DIAGNOSIS — S92352A Displaced fracture of fifth metatarsal bone, left foot, initial encounter for closed fracture: Secondary | ICD-10-CM

## 2018-08-27 NOTE — Patient Instructions (Signed)
Good to see you  Please use ice. 20 minutes at a time  Please try getting back to exercise  Please come out of the boot  Please let me know if you have pain over the next 2-3 weeks. If you do then we'll have to do an xray and consider a bone stimulator.  Have a Altamese CabalMerry Christmas!!

## 2018-08-28 NOTE — Assessment & Plan Note (Signed)
Has been in the cam walker for 3 months.  Reports that her swelling and pain is most significant in the morning. -We will discontinue the cam walker today.  Encourage her to walk with hard soled shoes to begin with.  May need to go into the boot if becoming fatigued at the end of the day.  - Counseled on icing.   - Advised to send a message through my chart if experiencing pain.  May need to consider a bone stimulator.  Would get imaging if pain is significant.

## 2018-08-28 NOTE — Progress Notes (Signed)
Leslie Cooper - 62 y.o. female MRN [id number]  Date of birth: 06/09/1956  SUBJECTIVE:  Including CC & ROS.  Chief Complaint  Patient presents with  . Follow-up    f/u on left foot . swelling later on during days    Leslie PerfectChristine L Cooper is a 62 y.o. female that is following up for her left fifth metatarsal fracture.  She has been walking without crutches.  She has been coming out of the boot and has minimal pain.  She does experience most pain and swelling at the beginning of the morning and has to rub her foot.    Review of Systems  Constitutional: Negative for fever.  HENT: Negative for congestion.   Respiratory: Negative for cough.   Cardiovascular: Negative for chest pain.  Gastrointestinal: Negative for abdominal pain.  Musculoskeletal: Positive for gait problem.  Skin: Negative for color change.  Neurological: Negative for weakness.  Hematological: Negative for adenopathy.  Psychiatric/Behavioral: Negative for agitation.    HISTORY: Past Medical, Surgical, Social, and Family History Reviewed & Updated per EMR.   Pertinent Historical Findings include:  Past Medical History:  Diagnosis Date  . Osteoporosis 2012    No past surgical history on file.  Allergies  Allergen Reactions  . Thimerosal     REACTION: Eye reaction; also no flu shots due to thimersol preservative    Family History  Problem Relation Age of Onset  . Heart disease Neg Hx   . Hypertension Neg Hx   . Cancer Neg Hx      Social History   Socioeconomic History  . Marital status: Married    Spouse name: Not on file  . Number of children: Not on file  . Years of education: Not on file  . Highest education level: Not on file  Occupational History  . Not on file  Social Needs  . Financial resource strain: Not on file  . Food insecurity:    Worry: Not on file    Inability: Not on file  . Transportation needs:    Medical: Not on file    Non-medical: Not on file  Tobacco Use  . Smoking  status: Never Smoker  . Smokeless tobacco: Never Used  Substance and Sexual Activity  . Alcohol use: Yes    Alcohol/week: 1.0 standard drinks    Types: 1 Glasses of wine per week  . Drug use: No  . Sexual activity: Yes    Birth control/protection: Post-menopausal  Lifestyle  . Physical activity:    Days per week: Not on file    Minutes per session: Not on file  . Stress: Not on file  Relationships  . Social connections:    Talks on phone: Not on file    Gets together: Not on file    Attends religious service: Not on file    Active member of club or organization: Not on file    Attends meetings of clubs or organizations: Not on file    Relationship status: Not on file  . Intimate partner violence:    Fear of current or ex partner: Not on file    Emotionally abused: Not on file    Physically abused: Not on file    Forced sexual activity: Not on file  Other Topics Concern  . Not on file  Social History Narrative   Caffienated drinks-yes   Seat belt use often-yes   Regular Exercise-yes   Smoke alarm in the home-yes   Firearms/guns in the home-yes  History of physical abuse-no                 PHYSICAL EXAM:  VS: BP 118/70 (BP Location: Right Arm, Patient Position: Sitting, Cuff Size: Normal)   Pulse (!) 59   Temp 98.3 F (36.8 C) (Oral)   Ht 5\' 8"  (1.727 m)   Wt 114 lb (51.7 kg)   SpO2 98%   BMI 17.33 kg/m  Physical Exam Gen: NAD, alert, cooperative with exam, well-appearing ENT: normal lips, normal nasal mucosa,  Eye: normal EOM, normal conjunctiva and lids CV:  no edema, +2 pedal pulses   Resp: no accessory muscle use, non-labored,   Skin: no rashes, no areas of induration  Neuro: normal tone, normal sensation to touch Psych:  normal insight, alert and oriented MSK:  Left foot: No significant tenderness palpation of the base the fifth metatarsal. No significant swelling. Neurovascular intact     ASSESSMENT & PLAN:   Closed fracture of base of  fifth metatarsal bone Has been in the cam walker for 3 months.  Reports that her swelling and pain is most significant in the morning. -We will discontinue the cam walker today.  Encourage her to walk with hard soled shoes to begin with.  May need to go into the boot if becoming fatigued at the end of the day.  - Counseled on icing.   - Advised to send a message through my chart if experiencing pain.  May need to consider a bone stimulator.  Would get imaging if pain is significant.

## 2018-09-06 ENCOUNTER — Encounter: Payer: Self-pay | Admitting: Gastroenterology

## 2019-07-25 IMAGING — DX DG FOOT COMPLETE 3+V*L*
3 series · 3 of 3 positions shown · non-contrast
Comparison: 06/04/2018.

CLINICAL DATA: Follow-up 5th metatarsal fracture.

EXAM:
LEFT FOOT - COMPLETE 3+ VIEW

[foot ap]
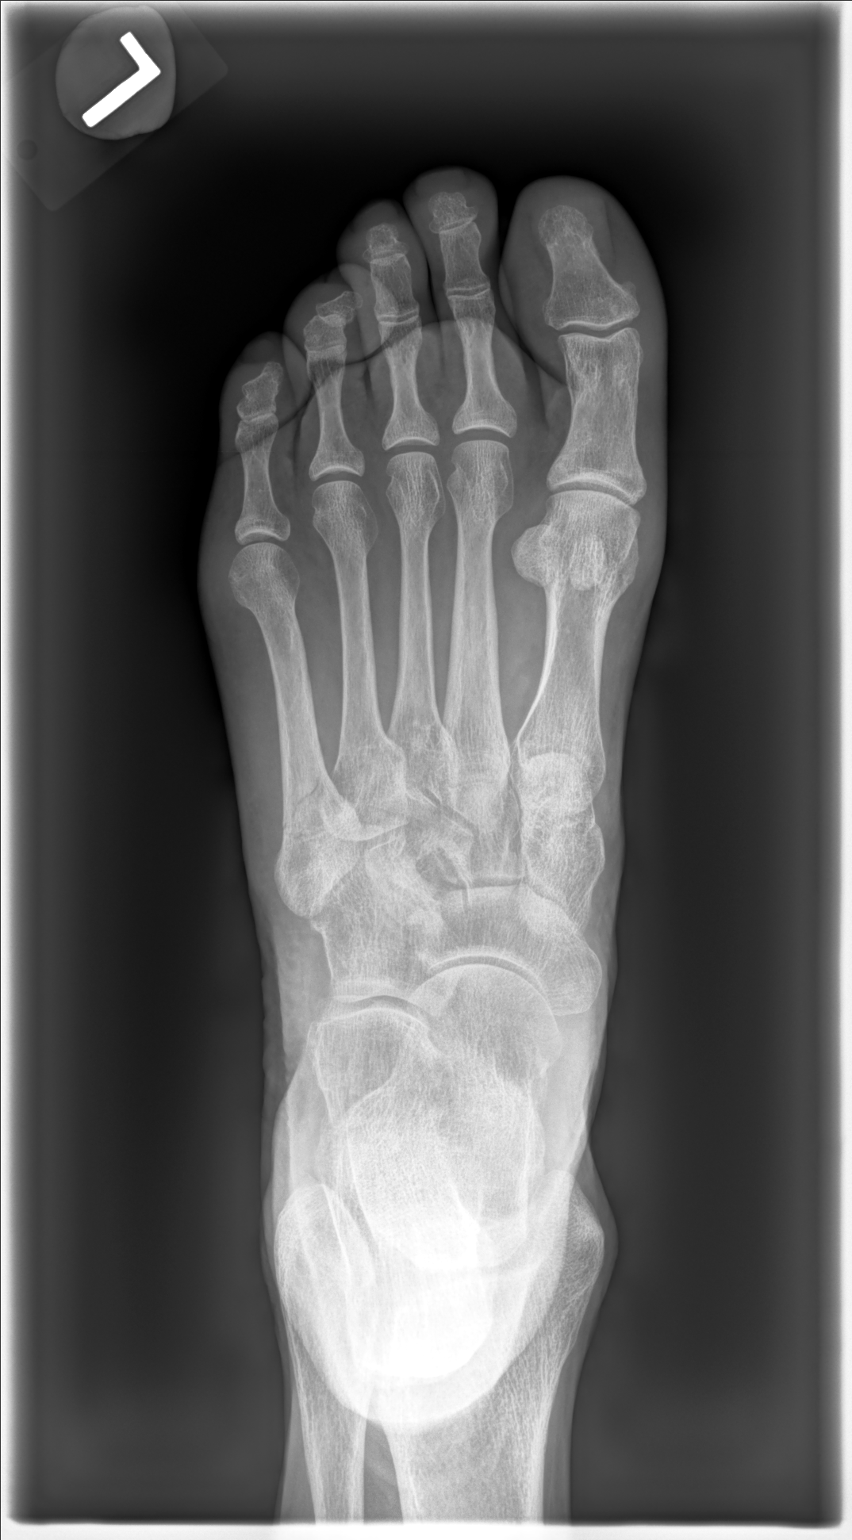

[foot mlo]
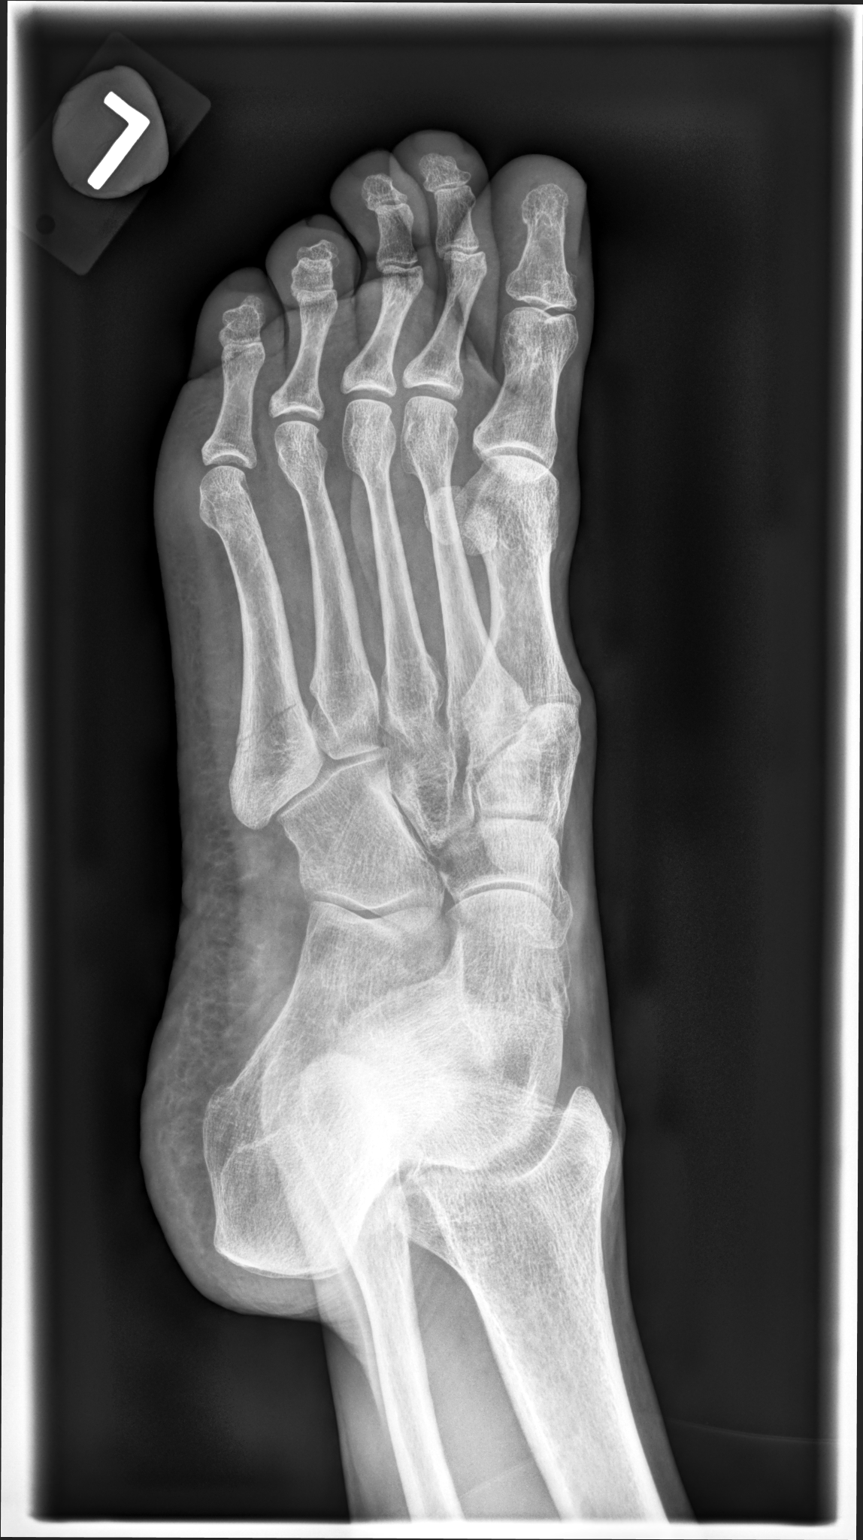

[foot lat]
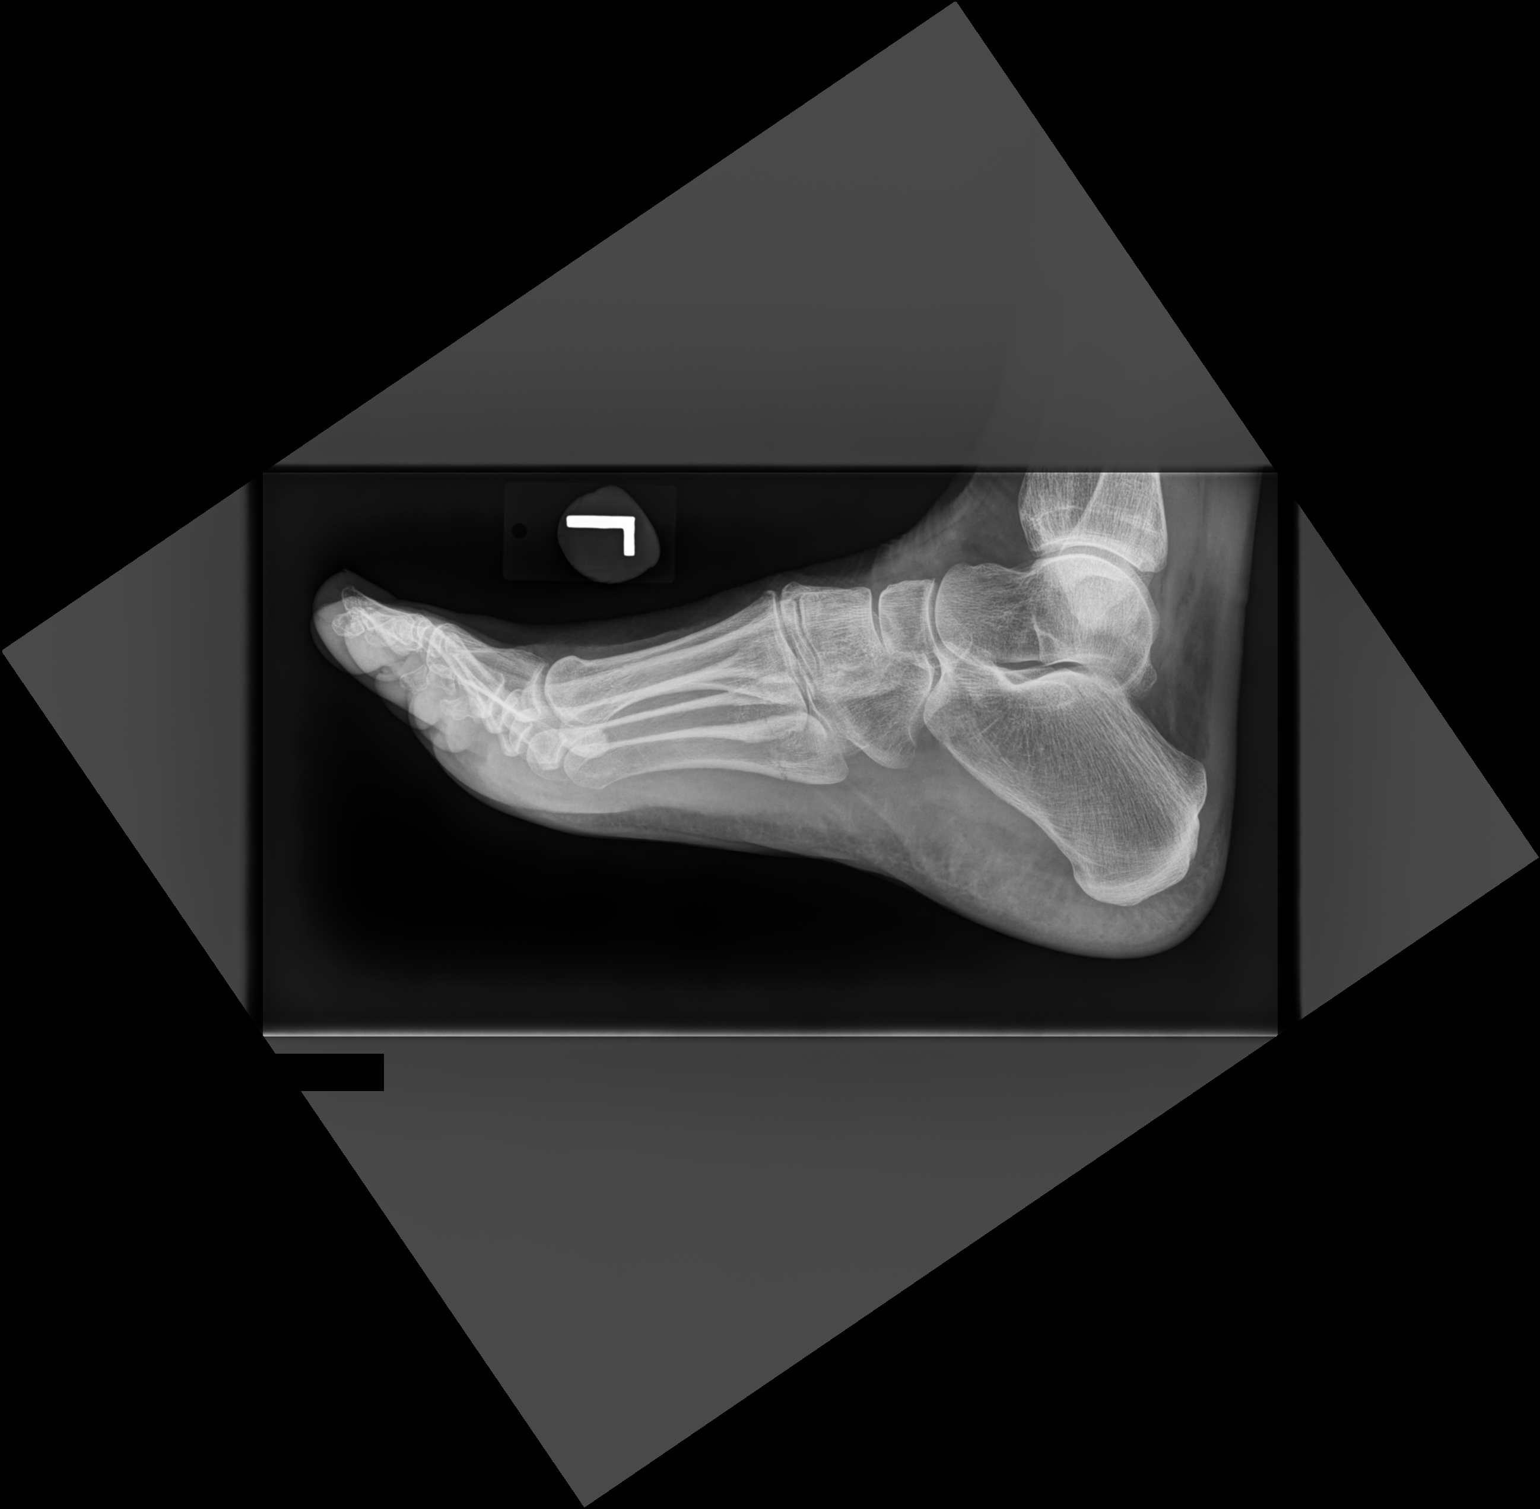

[3 of 3 positions shown; findings below may reference images not displayed]

FINDINGS: There has been no significant change in the previously demonstrated
nondisplaced and nonangulated fracture base of the 5th metatarsal.
No interval callus formation or bone resorption.
IMPRESSION: Stable nondisplaced 5th metatarsal fracture.

## 2019-09-12 IMAGING — DX DG FOOT COMPLETE 3+V*L*
3 series · 3 of 3 positions shown · non-contrast
Comparison: 06/11/2018

CLINICAL DATA: Followup fifth metatarsal fracture

EXAM:
LEFT FOOT - COMPLETE 3+ VIEW

[foot ap]
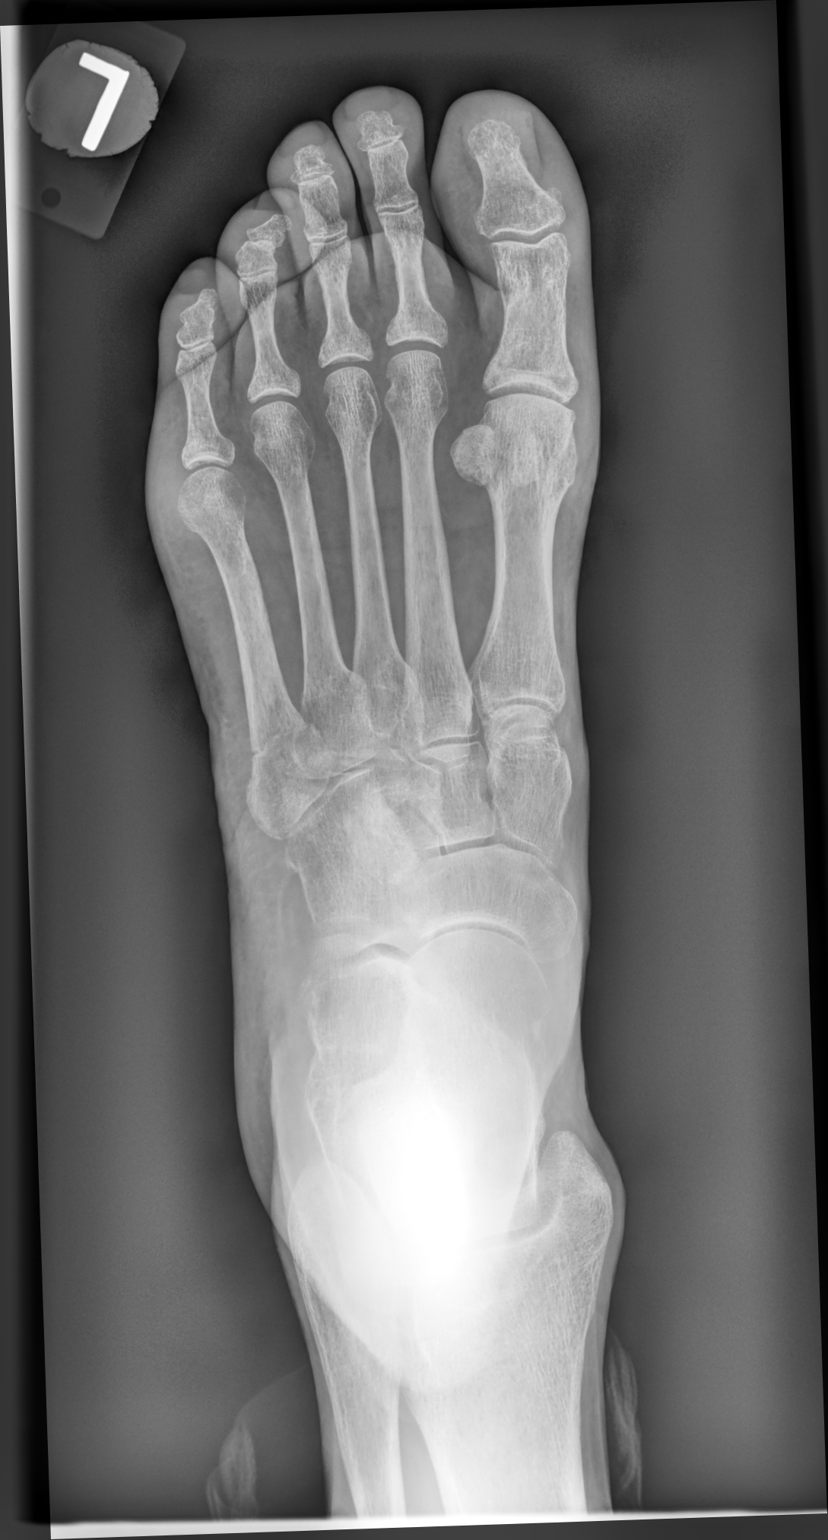

[foot mlo]
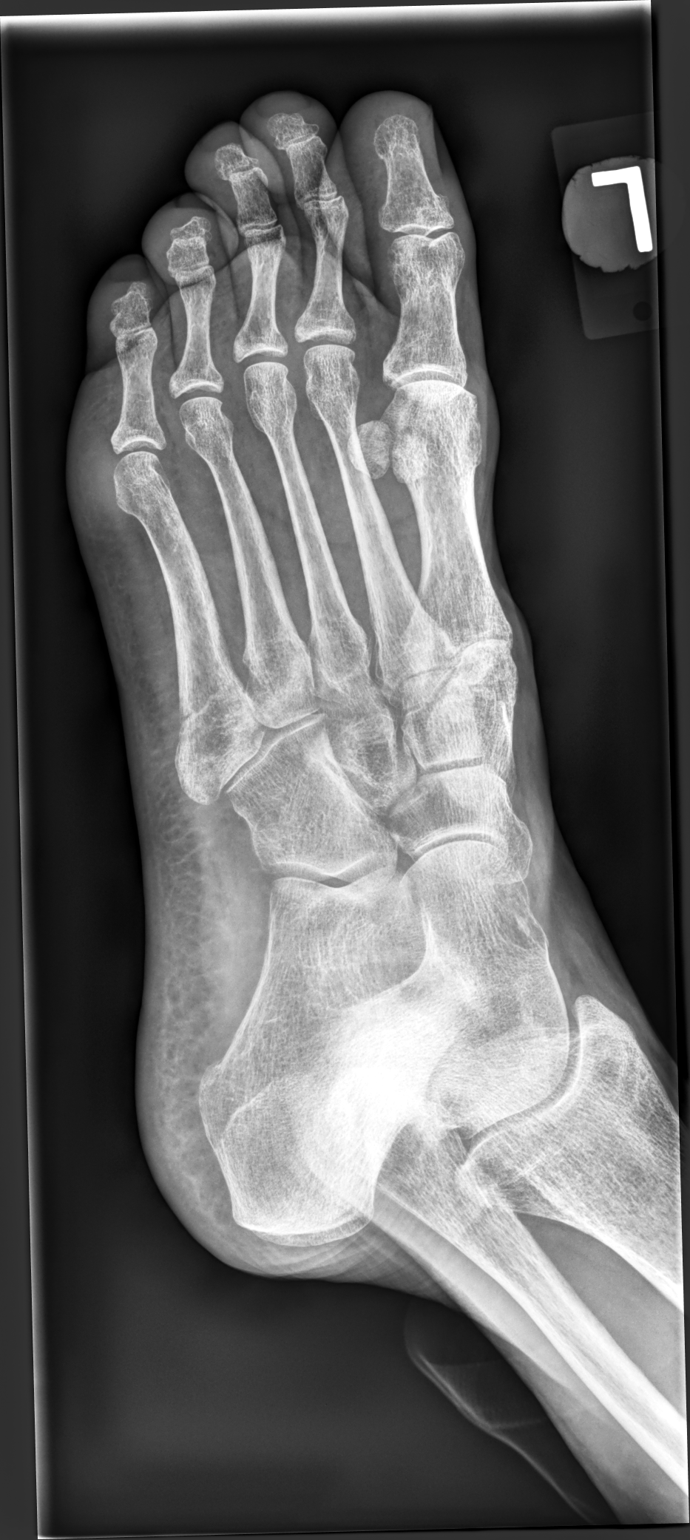

[foot lat]
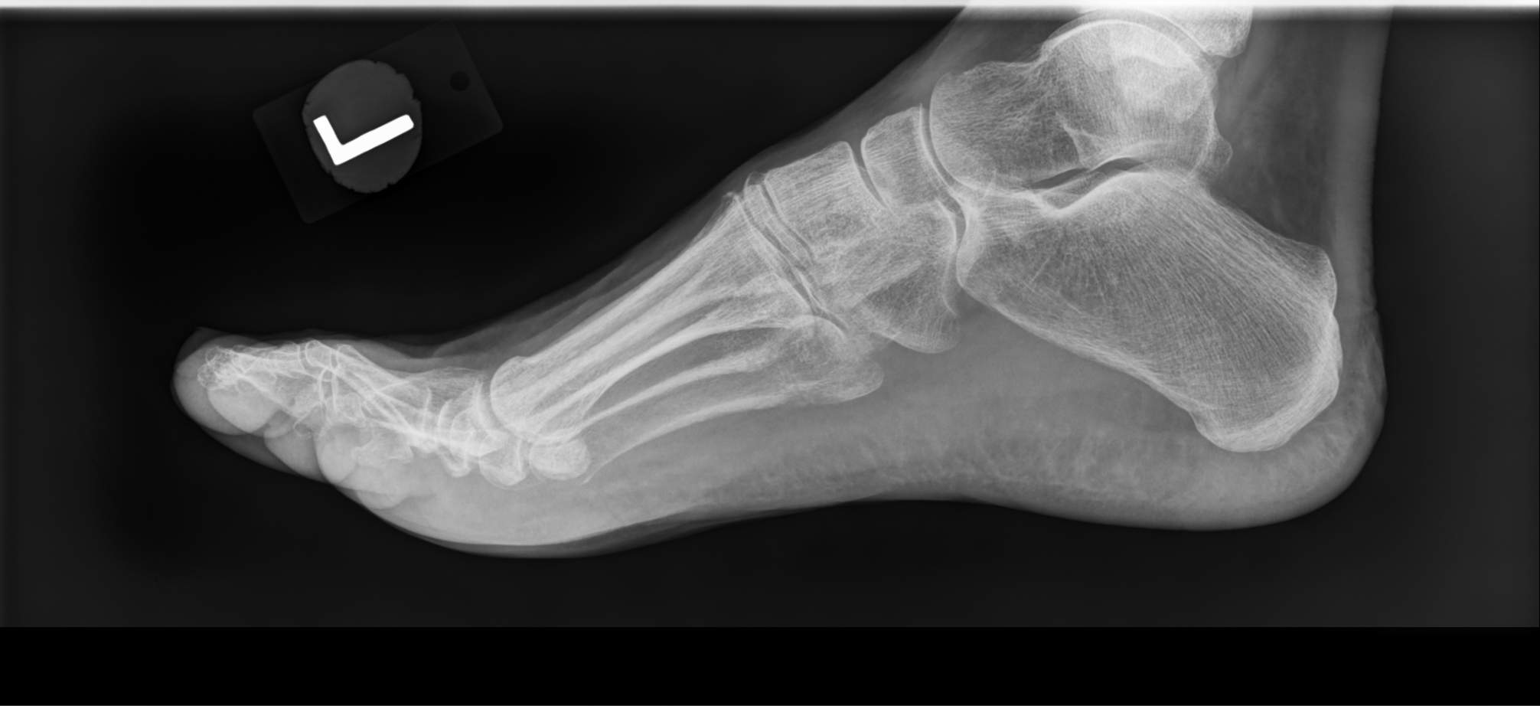

[3 of 3 positions shown; findings below may reference images not displayed]

FINDINGS: Transverse to oblique fracture of the proximal diaphysis of the
fifth metatarsal shows evidence of ongoing healing with less
distinctness of the fracture line. Healing does not appear complete
at this time however. No evidence of angulation, displacement or
other unexpected finding.
IMPRESSION: Fracture of the proximal fifth metatarsal in the process of healing.
Fracture line less distinct, but still visible.

## 2019-10-31 ENCOUNTER — Telehealth: Payer: Self-pay

## 2019-10-31 NOTE — Telephone Encounter (Signed)
Patient has not been seen recently enough to know what blood work is needed. Also, Dr. Yetta Barre does not typically order labs prior to appointments.

## 2019-10-31 NOTE — Telephone Encounter (Signed)
New message    The patient is asking for blood work before the 12/11/19 appt.

## 2019-12-03 ENCOUNTER — Ambulatory Visit (INDEPENDENT_AMBULATORY_CARE_PROVIDER_SITE_OTHER)
Admission: RE | Admit: 2019-12-03 | Discharge: 2019-12-03 | Disposition: A | Discharge status: Home / Self Care | Payer: BC Managed Care – PPO | Source: Ambulatory Visit

## 2019-12-03 DIAGNOSIS — J0100 Acute maxillary sinusitis, unspecified: Secondary | ICD-10-CM | POA: Diagnosis not present

## 2019-12-03 MED ORDER — AMOXICILLIN-POT CLAVULANATE 875-125 MG PO TABS: 1.0000 | ORAL_TABLET | Freq: Two times a day (BID) | ORAL | 0 refills | Status: DC

## 2019-12-03 NOTE — ED Provider Notes (Signed)
Virtual Visit via Video Note:  TIM WILHIDE  initiated request for Telemedicine visit with [facility] team. I connected with [name]  on 12/03/2019 at 1:42 PM  for a synchronized telemedicine visit using a video enabled HIPPA compliant telemedicine application. I verified that I am speaking with [name]  using two identifiers. Mickie Bail, NP  was physically located in a [facility]gent [facility] site and RAELYNNE LUDWICK was located at a different location.   The limitations of evaluation and management by telemedicine as well as the availability of in-person appointments were discussed. Patient was informed that she  may incur a bill ( including co-pay) for this virtual visit encounter. Makahla L Mccampbell  expressed understanding and gave verbal consent to proceed with virtual visit.     History of Present Illness:Leslie Cooper  is a 64 y.o. female presents for evaluation of 8 day history of sinus congestion, nonproductive cough, post nasal drip, maxillary tenderness.  She denies fever, chills, shortness of breath, vomiting, diarrhea, rash, or other symptoms.  No treatments attempted at home.     Allergies  Allergen Reactions  . Thimerosal     REACTION: Eye reaction; also no flu shots due to thimersol preservative     Past Medical History:  Diagnosis Date  . Osteoporosis 2012     Social History   Tobacco Use  . Smoking status: Never Smoker  . Smokeless tobacco: Never Used  Substance Use Topics  . Alcohol use: Yes    Alcohol/week: 1.0 standard drinks    Types: 1 Glasses of wine per week  . Drug use: No    ROS: as stated in HPI.  All other systems reviewed and negative.      Observations/Objective: Physical Exam  VITALS: Patient denies fever. GENERAL: Alert, appears well and in no acute distress. HEENT: Atraumatic. NECK: Normal movements of the head and neck. CARDIOPULMONARY: No increased WOB. Speaking in clear sentences.  I:E ratio WNL.  MS: Moves all visible extremities without noticeable abnormality. PSYCH: Pleasant and cooperative, well-groomed. Speech normal rate and rhythm. Affect is appropriate. Insight and judgement are appropriate. Attention is focused, linear, and appropriate.  NEURO: CN grossly intact. Oriented as arrived to appointment on time with no prompting. Moves both UE equally.  SKIN: No obvious lesions, wounds, erythema, or cyanosis noted on face or hands.   Assessment and Plan:    ICD-10-CM   1. Acute non-recurrent maxillary sinusitis  J01.00        Follow Up Instructions: Treating with Augmentin.  Instructed patient to take Tylenol or ibuprofen as needed for discomfort and Mucinex as needed for congestion.  Instructed her to follow-up with her PCP if her symptoms or not improving.  Patient agrees to plan of care.      I discussed the assessment and treatment plan with the patient. The patient was provided an opportunity to ask questions and all were answered. The patient agreed with the plan and demonstrated an understanding of the instructions.   The patient was advised to call back or seek an in-person evaluation if the symptoms worsen or if the condition fails to improve as anticipated.      Mickie Bail, NP  12/03/2019 1:42 PM         Mickie Bail, NP 12/03/19 1342

## 2019-12-03 NOTE — Discharge Instructions (Addendum)
Take the antibiotic as directed.  Follow up with your primary care provider if your symptoms are not improving.     

## 2019-12-11 ENCOUNTER — Encounter: Payer: Self-pay | Admitting: Internal Medicine

## 2019-12-11 ENCOUNTER — Ambulatory Visit (INDEPENDENT_AMBULATORY_CARE_PROVIDER_SITE_OTHER): Payer: BC Managed Care – PPO | Admitting: Internal Medicine

## 2019-12-11 ENCOUNTER — Other Ambulatory Visit: Payer: Self-pay

## 2019-12-11 VITALS — BP 116/74 | HR 64 | Temp 97.8°F | Ht 68.0 in | Wt 115.0 lb

## 2019-12-11 DIAGNOSIS — Z205 Contact with and (suspected) exposure to viral hepatitis: Secondary | ICD-10-CM

## 2019-12-11 DIAGNOSIS — Z0000 Encounter for general adult medical examination without abnormal findings: Secondary | ICD-10-CM | POA: Diagnosis not present

## 2019-12-11 DIAGNOSIS — Z23 Encounter for immunization: Secondary | ICD-10-CM | POA: Diagnosis not present

## 2019-12-11 DIAGNOSIS — Z1211 Encounter for screening for malignant neoplasm of colon: Secondary | ICD-10-CM

## 2019-12-11 LAB — LIPID PANEL
Cholesterol: 185 mg/dL (ref 0–200)
HDL: 75.8 mg/dL (ref >39.00)
LDL Cholesterol: 93 mg/dL (ref 0–99)
NonHDL: 109.2
Total CHOL/HDL Ratio: 2
Triglycerides: 80 mg/dL (ref 0.0–149.0)
VLDL: 16 mg/dL (ref 0.0–40.0)

## 2019-12-11 NOTE — Patient Instructions (Signed)
Health Maintenance, Female Adopting a healthy lifestyle and getting preventive care are important in promoting health and wellness. Ask your health care provider about:  The right schedule for you to have regular tests and exams.  Things you can do on your own to prevent diseases and keep yourself healthy. What should I know about diet, weight, and exercise? Eat a healthy diet   Eat a diet that includes plenty of vegetables, fruits, low-fat dairy products, and lean protein.  Do not eat a lot of foods that are high in solid fats, added sugars, or sodium. Maintain a healthy weight Body mass index (BMI) is used to identify weight problems. It estimates body fat based on height and weight. Your health care provider can help determine your BMI and help you achieve or maintain a healthy weight. Get regular exercise Get regular exercise. This is one of the most important things you can do for your health. Most adults should:  Exercise for at least 150 minutes each week. The exercise should increase your heart rate and make you sweat (moderate-intensity exercise).  Do strengthening exercises at least twice a week. This is in addition to the moderate-intensity exercise.  Spend less time sitting. Even light physical activity can be beneficial. Watch cholesterol and blood lipids Have your blood tested for lipids and cholesterol at 65 years of age, then have this test every 5 years. Have your cholesterol levels checked more often if:  Your lipid or cholesterol levels are high.  You are older than 65 years of age.  You are at high risk for heart disease. What should I know about cancer screening? Depending on your health history and family history, you may need to have cancer screening at various ages. This may include screening for:  Breast cancer.  Cervical cancer.  Colorectal cancer.  Skin cancer.  Lung cancer. What should I know about heart disease, diabetes, and high blood  pressure? Blood pressure and heart disease  High blood pressure causes heart disease and increases the risk of stroke. This is more likely to develop in people who have high blood pressure readings, are of African descent, or are overweight.  Have your blood pressure checked: ? Every 3-5 years if you are 18-39 years of age. ? Every year if you are 40 years old or older. Diabetes Have regular diabetes screenings. This checks your fasting blood sugar level. Have the screening done:  Once every three years after age 40 if you are at a normal weight and have a low risk for diabetes.  More often and at a younger age if you are overweight or have a high risk for diabetes. What should I know about preventing infection? Hepatitis B If you have a higher risk for hepatitis B, you should be screened for this virus. Talk with your health care provider to find out if you are at risk for hepatitis B infection. Hepatitis C Testing is recommended for:  Everyone born from 1945 through 1965.  Anyone with known risk factors for hepatitis C. Sexually transmitted infections (STIs)  Get screened for STIs, including gonorrhea and chlamydia, if: ? You are sexually active and are younger than 65 years of age. ? You are older than 65 years of age and your health care provider tells you that you are at risk for this type of infection. ? Your sexual activity has changed since you were last screened, and you are at increased risk for chlamydia or gonorrhea. Ask your health care provider if   you are at risk.  Ask your health care provider about whether you are at high risk for HIV. Your health care provider may recommend a prescription medicine to help prevent HIV infection. If you choose to take medicine to prevent HIV, you should first get tested for HIV. You should then be tested every 3 months for as long as you are taking the medicine. Pregnancy  If you are about to stop having your period (premenopausal) and  you may become pregnant, seek counseling before you get pregnant.  Take 400 to 800 micrograms (mcg) of folic acid every day if you become pregnant.  Ask for birth control (contraception) if you want to prevent pregnancy. Osteoporosis and menopause Osteoporosis is a disease in which the bones lose minerals and strength with aging. This can result in bone fractures. If you are 65 years old or older, or if you are at risk for osteoporosis and fractures, ask your health care provider if you should:  Be screened for bone loss.  Take a calcium or vitamin D supplement to lower your risk of fractures.  Be given hormone replacement therapy (HRT) to treat symptoms of menopause. Follow these instructions at home: Lifestyle  Do not use any products that contain nicotine or tobacco, such as cigarettes, e-cigarettes, and chewing tobacco. If you need help quitting, ask your health care provider.  Do not use street drugs.  Do not share needles.  Ask your health care provider for help if you need support or information about quitting drugs. Alcohol use  Do not drink alcohol if: ? Your health care provider tells you not to drink. ? You are pregnant, may be pregnant, or are planning to become pregnant.  If you drink alcohol: ? Limit how much you use to 0-1 drink a day. ? Limit intake if you are breastfeeding.  Be aware of how much alcohol is in your drink. In the [place] one drink equals one 12 oz bottle of beer (355 mL), one 5 oz glass of wine (148 mL), or one 1 oz glass of hard liquor (44 mL). General instructions  Schedule regular health, dental, and eye exams.  Stay current with your vaccines.  Tell your health care provider if: ? You often feel depressed. ? You have ever been abused or do not feel safe at home. Summary  Adopting a healthy lifestyle and getting preventive care are important in promoting health and wellness.  Follow your health care provider's instructions about healthy  diet, exercising, and getting tested or screened for diseases.  Follow your health care provider's instructions on monitoring your cholesterol and blood pressure. This information is not intended to replace advice given to you by your health care provider. Make sure you discuss any questions you have with your health care provider. Document Revised: 08/28/2018 Document Reviewed: 08/28/2018 Elsevier Patient Education  2020 [employer]  

## 2019-12-11 NOTE — Progress Notes (Signed)
Subjective:  Patient ID: Leslie Cooper, female    DOB: 1956-06-26  Age: 64 y.o. MRN: [id number]  CC: Annual Exam  This visit occurred during the SARS-CoV-2 public health emergency.  Safety protocols were in place, including screening questions prior to the visit, additional usage of staff PPE, and extensive cleaning of exam room while observing appropriate contact time as indicated for disinfecting solutions.    HPI Leslie Cooper presents for a CPX.  She feels well today and offers no complaints.  She has an appointment next week with her gynecologist.  She is active and denies any recent episodes of chest pain or shortness of breath.  Outpatient Medications Prior to Visit  Medication Sig Dispense Refill  . Cholecalciferol (VITAMIN D3) 1000 UNITS CAPS Take by mouth daily.      . Multiple Vitamins-Minerals (CENTRUM SILVER PO) Take by mouth daily.      Marland Kitchen amoxicillin-clavulanate (AUGMENTIN) 875-125 MG tablet Take 1 tablet by mouth every 12 (twelve) hours. 14 tablet 0  . Calcium-Magnesium-Vitamin D 200-100-33.3 MG-MG-UNIT CAPS      No facility-administered medications prior to visit.    ROS Review of Systems  Constitutional: Negative.  Negative for diaphoresis, fatigue and unexpected weight change.  HENT: Negative.   Eyes: Negative for visual disturbance.  Respiratory: Negative for chest tightness, shortness of breath and wheezing.   Cardiovascular: Negative for chest pain, palpitations and leg swelling.  Gastrointestinal: Negative for abdominal pain, constipation, diarrhea, nausea and vomiting.  Endocrine: Negative.   Genitourinary: Negative.  Negative for difficulty urinating.  Musculoskeletal: Negative.  Negative for myalgias.  Skin: Negative.   Neurological: Negative for dizziness, weakness and headaches.  Hematological: Negative for adenopathy. Does not bruise/bleed easily.  Psychiatric/Behavioral: Negative.     Objective:  BP 116/74 (BP Location: Left Arm,  Patient Position: Sitting, Cuff Size: Normal)   Pulse 64   Temp 97.8 F (36.6 C) (Oral)   Ht 5\' 8"  (1.727 m)   Wt 115 lb (52.2 kg)   SpO2 98%   BMI 17.49 kg/m   BP Readings from Last 3 Encounters:  12/11/19 116/74  08/27/18 118/70  07/30/18 120/80    Wt Readings from Last 3 Encounters:  12/11/19 115 lb (52.2 kg)  08/27/18 114 lb (51.7 kg)  07/30/18 114 lb (51.7 kg)    Physical Exam Vitals reviewed.  Constitutional:      Appearance: Normal appearance.  HENT:     Nose: Nose normal.     Mouth/Throat:     Mouth: Mucous membranes are moist.  Eyes:     General: No scleral icterus.    Conjunctiva/sclera: Conjunctivae normal.  Cardiovascular:     Rate and Rhythm: Normal rate and regular rhythm.     Heart sounds: No murmur.  Pulmonary:     Effort: Pulmonary effort is normal.     Breath sounds: No stridor. No wheezing, rhonchi or rales.  Abdominal:     General: Abdomen is flat. Bowel sounds are normal. There is no distension.     Palpations: Abdomen is soft. There is no hepatomegaly, splenomegaly or mass.     Tenderness: There is no abdominal tenderness.  Musculoskeletal:        General: Normal range of motion.     Cervical back: Neck supple.     Right lower leg: No edema.     Left lower leg: No edema.  Lymphadenopathy:     Cervical: No cervical adenopathy.  Skin:    General: Skin is warm  and dry.     Coloration: Skin is not pale.  Neurological:     General: No focal deficit present.     Mental Status: She is alert.  Psychiatric:        Mood and Affect: Mood normal.        Behavior: Behavior normal.     Lab Results  Component Value Date   CHOL 195 07/11/2011   TRIG 62 07/11/2011   HDL 87 (A) 07/11/2011   LDLCALC 96 07/11/2011   ALT 20 07/11/2011   AST 23 07/11/2011   NA 142 07/11/2011   K 4.0 07/11/2011   CL 104 07/11/2011   CREATININE 0.93 07/11/2011   BUN 15 07/11/2011    No results found.  Assessment & Plan:   Syrianna was seen today for  annual exam.  Diagnoses and all orders for this visit:  Routine general medical examination at a health care facility- Exam completed, labs reviewed-statin therapy is not indicated, she refused a flu vaccine, she has an appointment next week with her gynecologist to undergo breast and cervical cancer screening, cologuard ordered to screen For colon cancer/polyps, patient education material was given. -     Lipid panel; Future -     HIV Antibody (routine testing w rflx); Future  Exposure to hepatitis C-her husband has been successfully treated for hep C.  She continues to test negative. -     Hepatitis C antibody; Future  Need for Tdap vaccination -     Tdap vaccine greater than or equal to 7yo IM   I have discontinued Oluwadamilola L. Schear "Christie"'s Calcium-Magnesium-Vitamin D and amoxicillin-clavulanate. I am also having her maintain her Vitamin D3 and Multiple Vitamins-Minerals (CENTRUM SILVER PO).  No orders of the defined types were placed in this encounter.    Follow-up: No follow-ups on file.  Sanda Linger, MD

## 2019-12-12 ENCOUNTER — Encounter: Payer: Self-pay | Admitting: Internal Medicine

## 2019-12-12 LAB — HEPATITIS C ANTIBODY
Hepatitis C Ab: NONREACTIVE
SIGNAL TO CUT-OFF: 0.01 (ref <1.00)

## 2019-12-12 LAB — HIV ANTIBODY (ROUTINE TESTING W REFLEX): HIV 1&2 Ab, 4th Generation: NONREACTIVE

## 2019-12-16 LAB — HM DEXA SCAN: HM Dexa Scan: -3.7

## 2019-12-16 LAB — HM MAMMOGRAPHY

## 2019-12-17 ENCOUNTER — Other Ambulatory Visit: Payer: Self-pay | Admitting: Obstetrics and Gynecology

## 2019-12-17 DIAGNOSIS — R928 Other abnormal and inconclusive findings on diagnostic imaging of breast: Secondary | ICD-10-CM

## 2019-12-19 LAB — HM PAP SMEAR

## 2020-01-02 ENCOUNTER — Other Ambulatory Visit: Payer: Self-pay

## 2020-01-02 ENCOUNTER — Ambulatory Visit
Admission: RE | Admit: 2020-01-02 | Discharge: 2020-01-02 | Disposition: A | Discharge status: Home / Self Care | Payer: BC Managed Care – PPO | Source: Ambulatory Visit | Attending: Obstetrics and Gynecology | Admitting: Obstetrics and Gynecology

## 2020-01-02 DIAGNOSIS — R928 Other abnormal and inconclusive findings on diagnostic imaging of breast: Secondary | ICD-10-CM

## 2020-01-31 LAB — EXTERNAL GENERIC LAB PROCEDURE: COLOGUARD: NEGATIVE

## 2020-01-31 LAB — COLOGUARD: Cologuard: NEGATIVE

## 2020-02-02 ENCOUNTER — Encounter: Payer: Self-pay | Admitting: Internal Medicine

## 2020-03-31 ENCOUNTER — Ambulatory Visit: Payer: BC Managed Care – PPO | Admitting: Internal Medicine

## 2020-04-07 ENCOUNTER — Ambulatory Visit: Payer: BC Managed Care – PPO | Admitting: Internal Medicine

## 2020-04-07 ENCOUNTER — Other Ambulatory Visit: Payer: Self-pay

## 2020-04-07 ENCOUNTER — Encounter: Payer: Self-pay | Admitting: Internal Medicine

## 2020-04-07 VITALS — BP 122/72 | HR 78 | Temp 98.4°F | Resp 16 | Ht 68.0 in | Wt 112.0 lb

## 2020-04-07 DIAGNOSIS — A6920 Lyme disease, unspecified: Secondary | ICD-10-CM | POA: Diagnosis not present

## 2020-04-07 MED ORDER — DOXYCYCLINE HYCLATE 100 MG PO TABS: 100.0000 mg | ORAL_TABLET | Freq: Two times a day (BID) | ORAL | 0 refills | Status: AC

## 2020-04-07 NOTE — Progress Notes (Signed)
Subjective:  Patient ID: Leslie Cooper, female    DOB: June 02, 1956  Age: 64 y.o. MRN: [id number]  CC: Rash  This visit occurred during the SARS-CoV-2 public health emergency.  Safety protocols were in place, including screening questions prior to the visit, additional usage of staff PPE, and extensive cleaning of exam room while observing appropriate contact time as indicated for disinfecting solutions.    HPI Leslie Cooper presents for f/up - She was in [place] about 3 weeks ago.  2 days after returning to [place] she developed a rash over her right shoulder and later developed headache, blurred vision, nausea, neck stiffness, fatigue, and low-grade fever without chills.  She was initially seen at an urgent care center and was prescribed Bactrim and prednisone with no improvement in her symptoms.  She was seen again at an urgent care center about 5 days ago and a Lyme titer was drawn but has not been resulted yet.  They placed her on a 7-day course of doxycycline 100 mg twice a day.  She is starting to feel better but some of the symptoms persist.  Outpatient Medications Prior to Visit  Medication Sig Dispense Refill  . Cholecalciferol (VITAMIN D3) 1000 UNITS CAPS Take by mouth daily.      . Multiple Vitamins-Minerals (CENTRUM SILVER PO) Take by mouth daily.       No facility-administered medications prior to visit.    ROS Review of Systems  Constitutional: Positive for fatigue and fever. Negative for chills and diaphoresis.  HENT: Negative.   Eyes: Negative.   Respiratory: Negative for cough, chest tightness, shortness of breath and wheezing.   Cardiovascular: Negative for chest pain, palpitations and leg swelling.  Gastrointestinal: Positive for nausea. Negative for abdominal pain, constipation, diarrhea and vomiting.  Endocrine: Negative.   Genitourinary: Negative.  Negative for difficulty urinating, dysuria, frequency and hematuria.  Musculoskeletal: Positive for  arthralgias and neck stiffness. Negative for neck pain.  Skin: Positive for rash.  Neurological: Positive for headaches. Negative for dizziness, weakness and numbness.  Hematological: Negative for adenopathy. Does not bruise/bleed easily.  Psychiatric/Behavioral: Negative.     Objective:  BP 122/72   Pulse 78   Temp 98.4 F (36.9 C) (Oral)   Resp 16   Ht 5\' 8"  (1.727 m)   Wt 112 lb (50.8 kg)   SpO2 97%   BMI 17.03 kg/m   BP Readings from Last 3 Encounters:  04/07/20 122/72  12/11/19 116/74  08/27/18 118/70    Wt Readings from Last 3 Encounters:  04/07/20 112 lb (50.8 kg)  12/11/19 115 lb (52.2 kg)  08/27/18 114 lb (51.7 kg)    Physical Exam Vitals reviewed.  Constitutional:      Appearance: Normal appearance. She is not ill-appearing, toxic-appearing or diaphoretic.  HENT:     Nose: Nose normal.     Mouth/Throat:     Mouth: Mucous membranes are moist.     Pharynx: No oropharyngeal exudate or posterior oropharyngeal erythema.  Eyes:     General: No scleral icterus.    Conjunctiva/sclera: Conjunctivae normal.  Cardiovascular:     Rate and Rhythm: Normal rate and regular rhythm.     Heart sounds: No murmur heard.   Pulmonary:     Effort: Pulmonary effort is normal.     Breath sounds: No stridor. No wheezing, rhonchi or rales.  Abdominal:     General: Abdomen is flat.     Palpations: There is no mass.  Tenderness: There is no abdominal tenderness.  Musculoskeletal:        General: Normal range of motion.     Cervical back: Neck supple. No rigidity or tenderness.  Lymphadenopathy:     Cervical: No cervical adenopathy.  Skin:    General: Skin is warm and dry.     Findings: No rash.     Comments: There is no rash today.  Neurological:     General: No focal deficit present.     Mental Status: She is alert and oriented to person, place, and time. Mental status is at baseline.  Psychiatric:        Mood and Affect: Mood normal.        Behavior: Behavior  normal.     Lab Results  Component Value Date   WBC 6.5 04/07/2020   HGB 14.1 04/07/2020   HCT 41.4 04/07/2020   PLT 410 (H) 04/07/2020   GLUCOSE 105 (H) 04/07/2020   CHOL 185 12/11/2019   TRIG 80.0 12/11/2019   HDL 75.80 12/11/2019   LDLCALC 93 12/11/2019   ALT 55 (H) 04/07/2020   AST 20 04/07/2020   NA 140 04/07/2020   K 4.0 04/07/2020   CL 101 04/07/2020   CREATININE 0.85 04/07/2020   BUN 16 04/07/2020   CO2 32 04/07/2020    MM Digital Diagnostic Unilat L  Result Date: 01/02/2020 CLINICAL DATA:  Recall from screening mammography with tomosynthesis, calcifications involving the LEFT breast. EXAM: DIGITAL DIAGNOSTIC LEFT MAMMOGRAM WITH CAD COMPARISON:  Previous exam(s). ACR Breast Density Category d: The breast tissue is extremely dense, which lowers the sensitivity of mammography. FINDINGS: Standard spot magnification CC and mediolateral views of the LEFT breast calcifications and a standard 2D full field mediolateral view of the LEFT breast were obtained. The calcifications in the LOWER breast identified on screening mammography have a tram track appearance on the spot magnification images and the full field mediolateral image, indicating arterial calcifications. No suspicious branching calcifications are identified. The full field mediolateral image was processed with CAD. IMPRESSION: Benign arterial calcifications involving the LOWER LEFT breast which account for the screening mammographic findings. RECOMMENDATION: Screening mammogram in one year.(Code:SM-B-01Y) I have discussed the findings and recommendations with the patient. If applicable, a reminder letter will be sent to the patient regarding the next appointment. BI-RADS CATEGORY  2: Benign. Electronically Signed   By: Hulan Saas M.D.   On: 01/02/2020 12:00    Assessment & Plan:   Leslie Cooper was seen today for rash.  Diagnoses and all orders for this visit:  Lyme disease, acute- Her hx, symptoms and exam are  suspicious for Lyme disease.  I recommended that she extend the doxycycline for another 3 weeks.  She has a mildly elevated platelet count and LFTs.  She raised concern about needing a lumbar puncture but I do not think that is indicated at this time as she is improving.  It is also possible that she had an episode of viral meningitis.  She will let me know the results of the Lyme titer done at the urgent care center.  She will let me know if she has any new or worsening symptoms. -     CBC with Differential/Platelet; Future -     Basic metabolic panel; Future -     Sedimentation rate; Future -     Hepatic function panel; Future -     doxycycline (VIBRA-TABS) 100 MG tablet; Take 1 tablet (100 mg total) by mouth 2 (two) times  daily for 21 days. -     Hepatic function panel -     Sedimentation rate -     Basic metabolic panel -     CBC with Differential/Platelet   I am having Leslie Cooper "[name]" start on doxycycline. I am also having her maintain her Vitamin D3 and Multiple Vitamins-Minerals (CENTRUM SILVER PO).  Meds ordered this encounter  Medications  . doxycycline (VIBRA-TABS) 100 MG tablet    Sig: Take 1 tablet (100 mg total) by mouth 2 (two) times daily for 21 days.    Dispense:  42 tablet    Refill:  0     Follow-up: Return in about 4 weeks (around 05/05/2020).  Sanda Linger, MD

## 2020-04-07 NOTE — Patient Instructions (Signed)
Lyme Disease Lyme disease is an infection that can affect many parts of the body, including the skin, joints, and nervous system. It is a bacterial infection that starts from the bite of an infected tick. Over time, the infection can worsen, and some of the symptoms are similar to the flu. If Lyme disease is not treated, it may cause joint pain, swelling, numbness, problems thinking, fatigue, muscle weakness, and other problems. What are the causes? This condition is caused by bacteria called Borrelia burgdorferi.  You can get Lyme disease by being bitten by an infected tick.  Only black-legged, or Ixodes, ticks that are infected with the bacteria can cause Lyme disease.  The tick must be attached to your skin for a certain period of time to pass along the infection. This is usually 36-48 hours.  Deer often carry infected ticks. What increases the risk? The following factors may make you more likely to develop this condition:  Living in or visiting these areas in the [place] ? [place]. ? The [place]. ? [place].  Spending time in wooded or grassy areas.  Being outdoors with exposed skin.  Camping, gardening, hiking, fishing, hunting, or working outdoors.  Failing to remove a tick from your skin. What are the signs or symptoms? Symptoms of this condition may include:  Chills and fever.  Headache.  Fatigue.  General achiness.  Muscle pain.  Joint pain, often in the knees.  A round, red rash that surrounds the center of the tick bite. The center of the rash may be blood colored or have tiny blisters.  Swollen lymph glands.  Stiff neck. How is this diagnosed? This condition is diagnosed based on:  Your symptoms and medical history.  A physical exam.  A blood test. How is this treated? The main treatment for this condition is antibiotic medicine, which is usually taken by mouth (orally).  The length of treatment depends on how soon after a  tick bite you begin taking the medicine. In some cases, treatment is necessary for several weeks.  If the infection is severe, antibiotics may need to be given through an IV that is inserted into one of your veins. Follow these instructions at home:  Take over-the-counter and prescription medicines only as told by your health care provider. Finish all antibiotic medicine, even when you start to feel better.  Ask your health care provider about taking a probiotic in between doses of your antibiotic to help avoid an upset stomach or diarrhea.  Check with your health care provider before supplementing your treatment. Many alternative therapies have not been proven and may be harmful to you.  Keep all follow-up visits as told by your health care provider. This is important. How is this prevented? You can become reinfected if you get another tick bite from an infected tick. Take these steps to help prevent an infection:  Cover your skin with light-colored clothing when you are outdoors in the spring and summer months.  Spray clothing and skin with bug spray. The spray should be 20-30% DEET. You can also treat clothing with permethrin, and let it dry before you wear it. Do not apply permethrin directly to your skin. Permethrin can also be used to treat camping gear and boots. Always read and follow the instructions that come with a bug spray or insecticide.  Avoid wooded, grassy, and shaded areas.  Remove yard litter, brush, trash, and plants that attract deer and rodents.  Check yourself for ticks when   you come indoors.  Wash clothing worn each day.  Shower after spending time outdoors.  Check your pets for ticks before they come inside.  If you find a tick attached to your skin: ? Remove it with tweezers. ? Clean your hands and the bite area with rubbing alcohol or soap and water. ? Dispose of the tick by putting it in rubbing alcohol, putting it in a sealed bag or container, or  flushing it down the toilet. ? You may choose to save the tick in a sealed container if you wish for it to be tested at a later time. Pregnant women should take special care to avoid tick bites because it is possible that the infection may be passed along to the fetus. Contact a health care provider if:  You have symptoms after treatment.  You have removed a tick and want to bring it to your health care provider for testing. Get help right away if:  You have an irregular heartbeat.  You have chest pain.  You have nerve pain.  Your face feels numb.  You develop the following: ? A stiff neck. ? A severe headache. ? Severe nausea and vomiting. ? Sensitivity to light. Summary  Lyme disease is an infection that can affect many parts of the body, including the skin, joints, and nervous system.  This condition is caused by bacteria called Borrelia burgdorferi.  You can get Lyme disease by being bitten by an infected tick.  The main treatment for this condition is antibiotic medicine. This information is not intended to replace advice given to you by your health care provider. Make sure you discuss any questions you have with your health care provider. Document Revised: 12/27/2018 Document Reviewed: 11/21/2018 Elsevier Patient Education  2020 [employer]  

## 2020-04-08 LAB — CBC WITH DIFFERENTIAL/PLATELET
Absolute Monocytes: 585 cells/uL (ref 200–950)
Basophils Absolute: 91 cells/uL (ref 0–200)
Basophils Relative: 1.4 %
Eosinophils Absolute: 130 cells/uL (ref 15–500)
Eosinophils Relative: 2 %
HCT: 41.4 % (ref 35.0–45.0)
Hemoglobin: 14.1 g/dL (ref 11.7–15.5)
Lymphs Abs: 2314 cells/uL (ref 850–3900)
MCH: 32 pg (ref 27.0–33.0)
MCHC: 34.1 g/dL (ref 32.0–36.0)
MCV: 93.9 fL (ref 80.0–100.0)
MPV: 9.6 fL (ref 7.5–12.5)
Monocytes Relative: 9 %
Neutro Abs: 3380 cells/uL (ref 1500–7800)
Neutrophils Relative %: 52 %
Platelets: 410 10*3/uL — ABNORMAL HIGH (ref 140–400)
RBC: 4.41 10*6/uL (ref 3.80–5.10)
RDW: 12.1 % (ref 11.0–15.0)
Total Lymphocyte: 35.6 %
WBC: 6.5 10*3/uL (ref 3.8–10.8)

## 2020-04-08 LAB — SEDIMENTATION RATE: Sed Rate: 17 mm/h (ref 0–30)

## 2020-04-08 LAB — BASIC METABOLIC PANEL
BUN: 16 mg/dL (ref 7–25)
CO2: 32 mmol/L (ref 20–32)
Calcium: 9.6 mg/dL (ref 8.6–10.4)
Chloride: 101 mmol/L (ref 98–110)
Creat: 0.85 mg/dL (ref 0.50–0.99)
Glucose, Bld: 105 mg/dL — ABNORMAL HIGH (ref 65–99)
Potassium: 4 mmol/L (ref 3.5–5.3)
Sodium: 140 mmol/L (ref 135–146)

## 2020-04-08 LAB — HEPATIC FUNCTION PANEL
AG Ratio: 1.7 (calc) (ref 1.0–2.5)
ALT: 55 U/L — ABNORMAL HIGH (ref 6–29)
AST: 20 U/L (ref 10–35)
Albumin: 4 g/dL (ref 3.6–5.1)
Alkaline phosphatase (APISO): 151 U/L (ref 37–153)
Bilirubin, Direct: 0.1 mg/dL (ref 0.0–0.2)
Globulin: 2.4 g/dL (calc) (ref 1.9–3.7)
Indirect Bilirubin: 0.3 mg/dL (calc) (ref 0.2–1.2)
Total Bilirubin: 0.4 mg/dL (ref 0.2–1.2)
Total Protein: 6.4 g/dL (ref 6.1–8.1)

## 2020-04-09 ENCOUNTER — Encounter: Payer: Self-pay | Admitting: Internal Medicine

## 2020-04-11 ENCOUNTER — Encounter: Payer: Self-pay | Admitting: Internal Medicine

## 2020-05-10 ENCOUNTER — Ambulatory Visit: Payer: BC Managed Care – PPO | Admitting: Internal Medicine

## 2020-05-11 ENCOUNTER — Ambulatory Visit: Payer: BC Managed Care – PPO | Admitting: Internal Medicine

## 2020-05-12 ENCOUNTER — Other Ambulatory Visit: Payer: BC Managed Care – PPO

## 2020-05-12 ENCOUNTER — Encounter: Payer: Self-pay | Admitting: Internal Medicine

## 2020-05-12 ENCOUNTER — Other Ambulatory Visit: Payer: Self-pay

## 2020-05-12 ENCOUNTER — Ambulatory Visit: Payer: BC Managed Care – PPO | Admitting: Internal Medicine

## 2020-05-12 VITALS — BP 110/80 | HR 74 | Temp 98.7°F | Ht 68.0 in | Wt 114.0 lb

## 2020-05-12 DIAGNOSIS — A6920 Lyme disease, unspecified: Secondary | ICD-10-CM | POA: Diagnosis not present

## 2020-05-12 DIAGNOSIS — R7989 Other specified abnormal findings of blood chemistry: Secondary | ICD-10-CM

## 2020-05-12 LAB — HEPATIC FUNCTION PANEL
ALT: 18 U/L (ref 0–35)
AST: 18 U/L (ref 0–37)
Albumin: 4.5 g/dL (ref 3.5–5.2)
Alkaline Phosphatase: 63 U/L (ref 39–117)
Bilirubin, Direct: 0.1 mg/dL (ref 0.0–0.3)
Total Bilirubin: 0.6 mg/dL (ref 0.2–1.2)
Total Protein: 7.2 g/dL (ref 6.0–8.3)

## 2020-05-12 LAB — CBC WITH DIFFERENTIAL/PLATELET
Basophils Absolute: 0.1 10*3/uL (ref 0.0–0.1)
Basophils Relative: 1 % (ref 0.0–3.0)
Eosinophils Absolute: 0.1 10*3/uL (ref 0.0–0.7)
Eosinophils Relative: 2 % (ref 0.0–5.0)
HCT: 41.7 % (ref 36.0–46.0)
Hemoglobin: 13.9 g/dL (ref 12.0–15.0)
Lymphocytes Relative: 44.5 % (ref 12.0–46.0)
Lymphs Abs: 2.8 10*3/uL (ref 0.7–4.0)
MCHC: 33.4 g/dL (ref 30.0–36.0)
MCV: 94.8 fl (ref 78.0–100.0)
Monocytes Absolute: 0.6 10*3/uL (ref 0.1–1.0)
Monocytes Relative: 9.8 % (ref 3.0–12.0)
Neutro Abs: 2.7 10*3/uL (ref 1.4–7.7)
Neutrophils Relative %: 42.7 % — ABNORMAL LOW (ref 43.0–77.0)
Platelets: 247 10*3/uL (ref 150.0–400.0)
RBC: 4.4 Mil/uL (ref 3.87–5.11)
RDW: 14.2 % (ref 11.5–15.5)
WBC: 6.3 10*3/uL (ref 4.0–10.5)

## 2020-05-12 NOTE — Progress Notes (Signed)
Subjective:  Patient ID: Leslie Cooper, female    DOB: October 12, 1955  Age: 64 y.o. MRN: [id number]  CC: Lyme Disease  This visit occurred during the SARS-CoV-2 public health emergency.  Safety protocols were in place, including screening questions prior to the visit, additional usage of staff PPE, and extensive cleaning of exam room while observing appropriate contact time as indicated for disinfecting solutions.    HPI Leslie Cooper presents for f/up - A little more than a month ago she was diagnosed with Lyme disease.  She has completed a month of doxycycline.  Her only lingering symptom is mild headache and fatigue but those symptoms are improving.  She denies joint pain, joint swelling, abdominal pain, rash, or lymphadenopathy.  She wants to know if she has COVID-19 antibodies.  Outpatient Medications Prior to Visit  Medication Sig Dispense Refill  . Cholecalciferol (VITAMIN D3) 1000 UNITS CAPS Take by mouth daily.      . Multiple Vitamins-Minerals (CENTRUM SILVER PO) Take by mouth daily.       No facility-administered medications prior to visit.    ROS Review of Systems  Constitutional: Positive for fatigue. Negative for appetite change, chills, diaphoresis, fever and unexpected weight change.  HENT: Negative for sore throat and trouble swallowing.   Eyes: Negative.   Respiratory: Negative for cough, chest tightness, shortness of breath and wheezing.   Cardiovascular: Negative for chest pain, palpitations and leg swelling.  Gastrointestinal: Negative for abdominal pain, blood in stool, constipation, diarrhea, nausea and vomiting.  Endocrine: Negative.   Genitourinary: Negative.  Negative for difficulty urinating.  Musculoskeletal: Negative.  Negative for arthralgias, joint swelling and myalgias.  Skin: Negative.  Negative for color change and rash.  Neurological: Positive for headaches. Negative for dizziness, weakness and light-headedness.  Hematological: Negative for  adenopathy. Does not bruise/bleed easily.  Psychiatric/Behavioral: Negative.     Objective:  BP 110/80 (BP Location: Left Arm, Patient Position: Sitting, Cuff Size: Normal)   Pulse 74   Temp 98.7 F (37.1 C) (Oral)   Ht 5\' 8"  (1.727 m)   Wt 114 lb (51.7 kg)   SpO2 97%   BMI 17.33 kg/m   BP Readings from Last 3 Encounters:  05/12/20 110/80  04/07/20 122/72  12/11/19 116/74    Wt Readings from Last 3 Encounters:  05/12/20 114 lb (51.7 kg)  04/07/20 112 lb (50.8 kg)  12/11/19 115 lb (52.2 kg)    Physical Exam Vitals reviewed.  Constitutional:      Appearance: Normal appearance.  HENT:     Mouth/Throat:     Mouth: Mucous membranes are moist.  Eyes:     General: No scleral icterus.    Conjunctiva/sclera: Conjunctivae normal.  Cardiovascular:     Rate and Rhythm: Normal rate and regular rhythm.     Heart sounds: No murmur heard.      Comments: EKG - NSR with short PR, 67 bpm Otherwise normal EKG  Pulmonary:     Effort: Pulmonary effort is normal.     Breath sounds: No stridor. No wheezing, rhonchi or rales.  Abdominal:     General: Abdomen is flat.     Palpations: There is no hepatomegaly, splenomegaly or mass.     Tenderness: There is no abdominal tenderness.  Musculoskeletal:        General: No swelling or tenderness.     Cervical back: Neck supple.     Right lower leg: No edema.     Left lower leg: No edema.  Lymphadenopathy:     Cervical: No cervical adenopathy.  Skin:    General: Skin is warm and dry.  Neurological:     General: No focal deficit present.     Mental Status: She is alert.  Psychiatric:        Mood and Affect: Mood normal.        Behavior: Behavior normal.     Lab Results  Component Value Date   WBC 6.3 05/12/2020   HGB 13.9 05/12/2020   HCT 41.7 05/12/2020   PLT 247.0 05/12/2020   GLUCOSE 105 (H) 04/07/2020   CHOL 185 12/11/2019   TRIG 80.0 12/11/2019   HDL 75.80 12/11/2019   LDLCALC 93 12/11/2019   ALT 18 05/12/2020    AST 18 05/12/2020   NA 140 04/07/2020   K 4.0 04/07/2020   CL 101 04/07/2020   CREATININE 0.85 04/07/2020   BUN 16 04/07/2020   CO2 32 04/07/2020    MM Digital Diagnostic Unilat L  Result Date: 01/02/2020 CLINICAL DATA:  Recall from screening mammography with tomosynthesis, calcifications involving the LEFT breast. EXAM: DIGITAL DIAGNOSTIC LEFT MAMMOGRAM WITH CAD COMPARISON:  Previous exam(s). ACR Breast Density Category d: The breast tissue is extremely dense, which lowers the sensitivity of mammography. FINDINGS: Standard spot magnification CC and mediolateral views of the LEFT breast calcifications and a standard 2D full field mediolateral view of the LEFT breast were obtained. The calcifications in the LOWER breast identified on screening mammography have a tram track appearance on the spot magnification images and the full field mediolateral image, indicating arterial calcifications. No suspicious branching calcifications are identified. The full field mediolateral image was processed with CAD. IMPRESSION: Benign arterial calcifications involving the LOWER LEFT breast which account for the screening mammographic findings. RECOMMENDATION: Screening mammogram in one year.(Code:SM-B-01Y) I have discussed the findings and recommendations with the patient. If applicable, a reminder letter will be sent to the patient regarding the next appointment. BI-RADS CATEGORY  2: Benign. Electronically Signed   By: Hulan Saas M.D.   On: 01/02/2020 12:00    Assessment & Plan:   Leslie Cooper was seen today for lyme disease.  Diagnoses and all orders for this visit:  Lyme disease, acute- Based on improving symptoms, her exam, and normalized labs this has resolved.  There is no evidence of cardiac involvement. -     Cancel: CBC with Differential/Platelet; Future -     Cancel: Hepatic function panel; Future -     EKG 12-Lead -     Hepatic function panel; Future -     CBC with Differential/Platelet;  Future  Elevated LFTs- Her COVID-19 antibodies are negative.  I recommended that she get vaccinated against COVID-19. -     SARS-COV-2 IgG; Future   I am having Leslie Cooper "Leslie Cooper" maintain her Vitamin D3 and Multiple Vitamins-Minerals (CENTRUM SILVER PO).  No orders of the defined types were placed in this encounter.    Follow-up: Return in about 3 months (around 08/12/2020).  Leslie Linger, MD

## 2020-05-12 NOTE — Patient Instructions (Addendum)
Lyme Disease Lyme disease is an infection that can affect many parts of the body, including the skin, joints, and nervous system. It is a bacterial infection that starts from the bite of an infected tick. Over time, the infection can worsen, and some of the symptoms are similar to the flu. If Lyme disease is not treated, it may cause joint pain, swelling, numbness, problems thinking, fatigue, muscle weakness, and other problems. What are the causes? This condition is caused by bacteria called Borrelia burgdorferi.  You can get Lyme disease by being bitten by an infected tick.  Only black-legged, or Ixodes, ticks that are infected with the bacteria can cause Lyme disease.  The tick must be attached to your skin for a certain period of time to pass along the infection. This is usually 36-48 hours.  Deer often carry infected ticks. What increases the risk? The following factors may make you more likely to develop this condition:  Living in or visiting these areas in the [place] ? [place]. ? [place]. ? [place].  Spending time in wooded or grassy areas.  Being outdoors with exposed skin.  Camping, gardening, hiking, fishing, hunting, or working outdoors.  Failing to remove a tick from your skin. What are the signs or symptoms? Symptoms of this condition may include:  Chills and fever.  Headache.  Fatigue.  General achiness.  Muscle pain.  Joint pain, often in the knees.  A round, red rash that surrounds the center of the tick bite. The center of the rash may be blood colored or have tiny blisters.  Swollen lymph glands.  Stiff neck. How is this diagnosed? This condition is diagnosed based on:  Your symptoms and medical history.  A physical exam.  A blood test. How is this treated? The main treatment for this condition is antibiotic medicine, which is usually taken by mouth (orally).  The length of treatment depends on how soon after a  tick bite you begin taking the medicine. In some cases, treatment is necessary for several weeks.  If the infection is severe, antibiotics may need to be given through an IV that is inserted into one of your veins. Follow these instructions at home:  Take over-the-counter and prescription medicines only as told by your health care provider. Finish all antibiotic medicine, even when you start to feel better.  Ask your health care provider about taking a probiotic in between doses of your antibiotic to help avoid an upset stomach or diarrhea.  Check with your health care provider before supplementing your treatment. Many alternative therapies have not been proven and may be harmful to you.  Keep all follow-up visits as told by your health care provider. This is important. How is this prevented? You can become reinfected if you get another tick bite from an infected tick. Take these steps to help prevent an infection:  Cover your skin with light-colored clothing when you are outdoors in the spring and summer months.  Spray clothing and skin with bug spray. The spray should be 20-30% DEET. You can also treat clothing with permethrin, and let it dry before you wear it. Do not apply permethrin directly to your skin. Permethrin can also be used to treat camping gear and boots. Always read and follow the instructions that come with a bug spray or insecticide.  Avoid wooded, grassy, and shaded areas.  Remove yard litter, brush, trash, and plants that attract deer and rodents.  Check yourself for ticks when   you come indoors.  Wash clothing worn each day.  Shower after spending time outdoors.  Check your pets for ticks before they come inside.  If you find a tick attached to your skin: ? Remove it with tweezers. ? Clean your hands and the bite area with rubbing alcohol or soap and water. ? Dispose of the tick by putting it in rubbing alcohol, putting it in a sealed bag or container, or  flushing it down the toilet. ? You may choose to save the tick in a sealed container if you wish for it to be tested at a later time. Pregnant women should take special care to avoid tick bites because it is possible that the infection may be passed along to the fetus. Contact a health care provider if:  You have symptoms after treatment.  You have removed a tick and want to bring it to your health care provider for testing. Get help right away if:  You have an irregular heartbeat.  You have chest pain.  You have nerve pain.  Your face feels numb.  You develop the following: ? A stiff neck. ? A severe headache. ? Severe nausea and vomiting. ? Sensitivity to light. Summary  Lyme disease is an infection that can affect many parts of the body, including the skin, joints, and nervous system.  This condition is caused by bacteria called Borrelia burgdorferi.  You can get Lyme disease by being bitten by an infected tick.  The main treatment for this condition is antibiotic medicine. This information is not intended to replace advice given to you by your health care provider. Make sure you discuss any questions you have with your health care provider. Document Revised: 12/27/2018 Document Reviewed: 11/21/2018 Elsevier Patient Education  2020 [employer]  Lyme Disease Lyme disease is an infection that can affect many parts of the body, including the skin, joints, and nervous system. It is a bacterial infection that starts from the bite of an infected tick. Over time, the infection can worsen, and some of the symptoms are similar to the flu. If Lyme disease is not treated, it may cause joint pain, swelling, numbness, problems thinking, fatigue, muscle weakness, and other problems. What are the causes? This condition is caused by bacteria called Borrelia burgdorferi.  You can get Lyme disease by being bitten by an infected tick.  Only black-legged, or Ixodes, ticks that are  infected with the bacteria can cause Lyme disease.  The tick must be attached to your skin for a certain period of time to pass along the infection. This is usually 36-48 hours.  Deer often carry infected ticks. What increases the risk? The following factors may make you more likely to develop this condition:  Living in or visiting these areas in the [place] ? [place]. ? [place]. ? [place].  Spending time in wooded or grassy areas.  Being outdoors with exposed skin.  Camping, gardening, hiking, fishing, hunting, or working outdoors.  Failing to remove a tick from your skin. What are the signs or symptoms? Symptoms of this condition may include:  Chills and fever.  Headache.  Fatigue.  General achiness.  Muscle pain.  Joint pain, often in the knees.  A round, red rash that surrounds the center of the tick bite. The center of the rash may be blood colored or have tiny blisters.  Swollen lymph glands.  Stiff neck. How is this diagnosed? This condition is diagnosed based on:  Your symptoms  and medical history.  A physical exam.  A blood test. How is this treated? The main treatment for this condition is antibiotic medicine, which is usually taken by mouth (orally).  The length of treatment depends on how soon after a tick bite you begin taking the medicine. In some cases, treatment is necessary for several weeks.  If the infection is severe, antibiotics may need to be given through an IV that is inserted into one of your veins. Follow these instructions at home:  Take over-the-counter and prescription medicines only as told by your health care provider. Finish all antibiotic medicine, even when you start to feel better.  Ask your health care provider about taking a probiotic in between doses of your antibiotic to help avoid an upset stomach or diarrhea.  Check with your health care provider before supplementing your treatment. Many  alternative therapies have not been proven and may be harmful to you.  Keep all follow-up visits as told by your health care provider. This is important. How is this prevented? You can become reinfected if you get another tick bite from an infected tick. Take these steps to help prevent an infection:  Cover your skin with light-colored clothing when you are outdoors in the spring and summer months.  Spray clothing and skin with bug spray. The spray should be 20-30% DEET. You can also treat clothing with permethrin, and let it dry before you wear it. Do not apply permethrin directly to your skin. Permethrin can also be used to treat camping gear and boots. Always read and follow the instructions that come with a bug spray or insecticide.  Avoid wooded, grassy, and shaded areas.  Remove yard litter, brush, trash, and plants that attract deer and rodents.  Check yourself for ticks when you come indoors.  Wash clothing worn each day.  Shower after spending time outdoors.  Check your pets for ticks before they come inside.  If you find a tick attached to your skin: ? Remove it with tweezers. ? Clean your hands and the bite area with rubbing alcohol or soap and water. ? Dispose of the tick by putting it in rubbing alcohol, putting it in a sealed bag or container, or flushing it down the toilet. ? You may choose to save the tick in a sealed container if you wish for it to be tested at a later time. Pregnant women should take special care to avoid tick bites because it is possible that the infection may be passed along to the fetus. Contact a health care provider if:  You have symptoms after treatment.  You have removed a tick and want to bring it to your health care provider for testing. Get help right away if:  You have an irregular heartbeat.  You have chest pain.  You have nerve pain.  Your face feels numb.  You develop the following: ? A stiff neck. ? A severe  headache. ? Severe nausea and vomiting. ? Sensitivity to light. Summary  Lyme disease is an infection that can affect many parts of the body, including the skin, joints, and nervous system.  This condition is caused by bacteria called Borrelia burgdorferi.  You can get Lyme disease by being bitten by an infected tick.  The main treatment for this condition is antibiotic medicine. This information is not intended to replace advice given to you by your health care provider. Make sure you discuss any questions you have with your health care provider. Document Revised: 12/27/2018  Document Reviewed: 11/21/2018 Elsevier Patient Education  [employer].

## 2020-05-13 LAB — SARS-COV-2 IGG: SARS-COV-2 IgG: 0.03

## 2020-05-14 ENCOUNTER — Encounter: Payer: Self-pay | Admitting: Internal Medicine

## 2020-10-18 ENCOUNTER — Telehealth: Payer: BC Managed Care – PPO | Admitting: Physician Assistant

## 2020-10-18 DIAGNOSIS — U071 COVID-19: Secondary | ICD-10-CM | POA: Diagnosis not present

## 2020-10-18 MED ORDER — FLUTICASONE PROPIONATE 50 MCG/ACT NA SUSP: 2.0000 | Freq: Every day | NASAL | 0 refills | Status: DC

## 2020-10-18 NOTE — Progress Notes (Signed)
I have spent 5 minutes in review of e-visit questionnaire, review and updating patient chart, medical decision making and response to patient.   Dione Petron Cody Lawan Nanez, PA-C    

## 2020-10-18 NOTE — Progress Notes (Signed)
E-Visit for Corona Virus Screening  We are sorry you are not feeling well. We are here to help!  You have tested positive for COVID-19, meaning that you were infected with the novel coronavirus and could give the virus to others.  It is vitally important that you stay home so you do not spread it to others.      Please continue isolation at home, for at least 10 days since the start of your symptoms and until you have had 24 hours with no fever (without taking a fever reducer) and with improving of symptoms.  If you have no symptoms but tested positive (or all symptoms resolve after 5 days and you have no fever) you can leave your house but continue to wear a mask around others for an additional 5 days. If you have a fever,continue to stay home until you have had 24 hours of no fever. Most cases improve 5-10 days from onset but we have seen a small number of patients who have gotten worse after the 10 days.  Please be sure to watch for worsening symptoms and remain taking the proper precautions.   Go to the nearest hospital ED for assessment if fever/cough/breathlessness are severe or illness seems like a threat to life.    The following symptoms may appear 2-14 days after exposure: . Fever . Cough . Shortness of breath or difficulty breathing . Chills . Repeated shaking with chills . Muscle pain . Headache . Sore throat . New loss of taste or smell . Fatigue . Congestion or runny nose . Nausea or vomiting . Diarrhea  You have been enrolled in [facility] Monitoring for COVID-19. Daily you will receive a questionnaire within the MyChart website. Our COVID-19 response team will be monitoring your responses daily.  You can use medication such as A prescription cough medication called Tessalon Perles 100 mg. You may take 1-2 capsules every 8 hours as needed for cough and A prescription for Fluticasone nasal spray 2 sprays in each nostril one time per day. The flonase will help with sinus  congestion and pressure. You likely have a viral sinusitis from COVID which antibiotics will not help. Your body has to fight this off and we need to help it. Start OTC Vitamin D3 1000 units daily and 1000 mg Vitamin C daily if not already doing so.    You may also take acetaminophen (Tylenol) as needed for fever.  HOME CARE: . Only take medications as instructed by your medical team. . Drink plenty of fluids and get plenty of rest. . A steam or ultrasonic humidifier can help if you have congestion.   GET HELP RIGHT AWAY IF YOU HAVE EMERGENCY WARNING SIGNS.  Call 911 or proceed to your closest emergency facility if: . You develop worsening high fever. . Trouble breathing . Bluish lips or face . Persistent pain or pressure in the chest . New confusion . Inability to wake or stay awake . You cough up blood. . Your symptoms become more severe . Inability to hold down food or fluids  This list is not all possible symptoms. Contact your medical provider for any symptoms that are severe or concerning to you.    Your e-visit answers were reviewed by a board certified advanced clinical practitioner to complete your personal care plan.  Depending on the condition, your plan could have included both over the counter or prescription medications.  If there is a problem please reply once you have received a response  from your provider.  Your safety is important to [place].  If you have drug allergies check your prescription carefully.    You can use MyChart to ask questions about today's visit, request a non-urgent call back, or ask for a work or school excuse for 24 hours related to this e-Visit. If it has been greater than 24 hours you will need to follow up with your provider, or enter a new e-Visit to address those concerns. You will get an e-mail in the next two days asking about your experience.  I hope that your e-visit has been valuable and will speed your recovery. Thank you for using  e-visits.

## 2021-03-14 ENCOUNTER — Ambulatory Visit: Payer: BC Managed Care – PPO | Admitting: Internal Medicine

## 2021-03-14 ENCOUNTER — Other Ambulatory Visit: Payer: Self-pay

## 2021-03-14 ENCOUNTER — Encounter: Payer: Self-pay | Admitting: Internal Medicine

## 2021-03-14 VITALS — BP 118/68 | HR 55 | Temp 98.4°F | Resp 18 | Ht 68.0 in | Wt 106.0 lb

## 2021-03-14 DIAGNOSIS — L659 Nonscarring hair loss, unspecified: Secondary | ICD-10-CM | POA: Diagnosis not present

## 2021-03-14 LAB — CBC
HCT: 40.4 % (ref 36.0–46.0)
Hemoglobin: 13.8 g/dL (ref 12.0–15.0)
MCHC: 34.1 g/dL (ref 30.0–36.0)
MCV: 93.4 fl (ref 78.0–100.0)
Platelets: 219 10*3/uL (ref 150.0–400.0)
RBC: 4.33 Mil/uL (ref 3.87–5.11)
RDW: 13.3 % (ref 11.5–15.5)
WBC: 5.6 10*3/uL (ref 4.0–10.5)

## 2021-03-14 LAB — COMPREHENSIVE METABOLIC PANEL
ALT: 26 U/L (ref 0–35)
AST: 28 U/L (ref 0–37)
Albumin: 4.4 g/dL (ref 3.5–5.2)
Alkaline Phosphatase: 62 U/L (ref 39–117)
BUN: 16 mg/dL (ref 6–23)
CO2: 31 mEq/L (ref 19–32)
Calcium: 9.9 mg/dL (ref 8.4–10.5)
Chloride: 102 mEq/L (ref 96–112)
Creatinine, Ser: 0.92 mg/dL (ref 0.40–1.20)
GFR: 65.31 mL/min (ref >60.00)
Glucose, Bld: 93 mg/dL (ref 70–99)
Potassium: 3.9 mEq/L (ref 3.5–5.1)
Sodium: 140 mEq/L (ref 135–145)
Total Bilirubin: 0.7 mg/dL (ref 0.2–1.2)
Total Protein: 6.6 g/dL (ref 6.0–8.3)

## 2021-03-14 LAB — TSH: TSH: 1.93 u[IU]/mL (ref 0.35–4.50)

## 2021-03-14 LAB — FERRITIN: Ferritin: 37.7 ng/mL (ref 10.0–291.0)

## 2021-03-14 LAB — VITAMIN B12: Vitamin B-12: 607 pg/mL (ref 211–911)

## 2021-03-14 NOTE — Patient Instructions (Signed)
We will check the labs today to see if there is a cause for the hair loss.  If labs are normal we will have you try biotin over the counter to help.

## 2021-03-14 NOTE — Progress Notes (Signed)
   Subjective:   Patient ID: Leslie Cooper, female    DOB: 09/25/1955, 65 y.o.   MRN: [id number]  HPI The patient is a 65 YO female coming in for concerns about hair thinning. Going on since November when she did not have a job. She denies other health changes or medication changes. She is taking otc vitamin only. She has not tried anything for hair loss. No bald spots or patches just more hair coming out than typical. Now has temporary job and feels like she is dealing with stress okay. Denies change to diet, exercise, is not vegetarian or vegan.   Review of Systems  Constitutional: Negative.   HENT: Negative.    Eyes: Negative.   Respiratory:  Negative for cough, chest tightness and shortness of breath.   Cardiovascular:  Negative for chest pain, palpitations and leg swelling.  Gastrointestinal:  Negative for abdominal distention, abdominal pain, constipation, diarrhea, nausea and vomiting.  Musculoskeletal: Negative.   Skin: Negative.   Neurological: Negative.   Psychiatric/Behavioral: Negative.     Objective:  Physical Exam Constitutional:      Appearance: She is well-developed.  HENT:     Head: Normocephalic and atraumatic.  Cardiovascular:     Rate and Rhythm: Normal rate and regular rhythm.  Pulmonary:     Effort: Pulmonary effort is normal. No respiratory distress.     Breath sounds: Normal breath sounds. No wheezing or rales.  Abdominal:     General: Bowel sounds are normal. There is no distension.     Palpations: Abdomen is soft.     Tenderness: There is no abdominal tenderness. There is no rebound.  Musculoskeletal:     Cervical back: Normal range of motion.  Skin:    General: Skin is warm and dry.  Neurological:     Mental Status: She is alert and oriented to person, place, and time.     Coordination: Coordination normal.    Vitals:   03/14/21 1444  BP: 118/68  Pulse: (!) 55  Resp: 18  Temp: 98.4 F (36.9 C)  TempSrc: Oral  SpO2: 99%  Weight: 106  lb (48.1 kg)  Height: 5\' 8"  (1.727 m)    This visit occurred during the SARS-CoV-2 public health emergency.  Safety protocols were in place, including screening questions prior to the visit, additional usage of staff PPE, and extensive cleaning of exam room while observing appropriate contact time as indicated for disinfecting solutions.   Assessment & Plan:

## 2021-03-15 ENCOUNTER — Telehealth: Payer: Self-pay

## 2021-03-15 LAB — VITAMIN D 25 HYDROXY (VIT D DEFICIENCY, FRACTURES): VITD: 104.2 ng/mL (ref 30.00–100.00)

## 2021-03-15 NOTE — Telephone Encounter (Signed)
CRITICAL VALUE STICKER  CRITICAL VALUE:  Vitamin D 104.2  RECEIVER (on-site recipient of call): Claudette Laws [facility]  DATE & TIME NOTIFIED: 03/15/21 at 0912  MESSENGER (representative from lab):  Crista Luria  MD NOTIFIED: Okey Dupre  TIME OF NOTIFICATION: 0914  RESPONSE:  Awaiting response

## 2021-03-15 NOTE — Telephone Encounter (Signed)
Will address via result note 

## 2021-03-16 DIAGNOSIS — L659 Nonscarring hair loss, unspecified: Secondary | ICD-10-CM | POA: Insufficient documentation

## 2021-03-16 NOTE — Assessment & Plan Note (Signed)
Checking CBC, CMP, TSH, B12, vitamin D, ferritin and adjust as needed. If normal can try biotin.

## 2021-03-30 ENCOUNTER — Ambulatory Visit (INDEPENDENT_AMBULATORY_CARE_PROVIDER_SITE_OTHER): Payer: BC Managed Care – PPO | Admitting: Internal Medicine

## 2021-03-30 ENCOUNTER — Other Ambulatory Visit: Payer: Self-pay

## 2021-03-30 ENCOUNTER — Encounter: Payer: Self-pay | Admitting: Internal Medicine

## 2021-03-30 VITALS — BP 122/62 | HR 71 | Temp 98.1°F | Resp 18 | Ht 68.0 in | Wt 108.6 lb

## 2021-03-30 DIAGNOSIS — Z0000 Encounter for general adult medical examination without abnormal findings: Secondary | ICD-10-CM

## 2021-03-30 LAB — HM MAMMOGRAPHY: HM Mammogram: NORMAL (ref 0–4)

## 2021-03-30 NOTE — Progress Notes (Signed)
Subjective:  Patient ID: Leslie Cooper, female    DOB: Feb 23, 1956  Age: 65 y.o. MRN: [id number]  CC: Annual Exam (No new concerns.)  This visit occurred during the SARS-CoV-2 public health emergency.  Safety protocols were in place, including screening questions prior to the visit, additional usage of staff PPE, and extensive cleaning of exam room while observing appropriate contact time as indicated for disinfecting solutions.    HPI Leslie Cooper presents for a CPX.   She has no lingering symptoms from the Lyme disease.  She denies fatigue, rash, lymphadenopathy, night sweats, fever, chills, swollen joints, or palpitations.  She is active and denies any recent episodes of chest pain or shortness of breath.  Outpatient Medications Prior to Visit  Medication Sig Dispense Refill   Cholecalciferol (VITAMIN D3) 1000 UNITS CAPS Take by mouth daily.       Multiple Vitamins-Minerals (CENTRUM SILVER PO) Take by mouth daily.       No facility-administered medications prior to visit.    ROS Review of Systems  Constitutional:  Negative for diaphoresis and fatigue.  HENT: Negative.    Eyes: Negative.   Respiratory:  Negative for cough, chest tightness, shortness of breath and wheezing.   Cardiovascular:  Negative for chest pain, palpitations and leg swelling.  Gastrointestinal:  Negative for abdominal pain, diarrhea and nausea.  Endocrine: Negative.   Genitourinary: Negative.   Musculoskeletal:  Negative for arthralgias and joint swelling.  Skin:  Negative for color change and rash.  Neurological: Negative.  Negative for facial asymmetry and headaches.  Hematological:  Negative for adenopathy. Does not bruise/bleed easily.  Psychiatric/Behavioral: Negative.     Objective:  BP 122/62   Pulse 71   Temp 98.1 F (36.7 C)   Resp 18   Ht 5\' 8"  (1.727 m)   Wt 108 lb 9.6 oz (49.3 kg)   SpO2 98%   BMI 16.51 kg/m   BP Readings from Last 3 Encounters:  03/30/21 122/62   03/14/21 118/68  05/12/20 110/80    Wt Readings from Last 3 Encounters:  03/30/21 108 lb 9.6 oz (49.3 kg)  03/14/21 106 lb (48.1 kg)  05/12/20 114 lb (51.7 kg)    Physical Exam Vitals reviewed.  Constitutional:      Appearance: Normal appearance.  HENT:     Nose: Nose normal.     Mouth/Throat:     Mouth: Mucous membranes are moist.  Eyes:     General: No scleral icterus.    Conjunctiva/sclera: Conjunctivae normal.  Cardiovascular:     Rate and Rhythm: Normal rate and regular rhythm.     Heart sounds: No murmur heard. Pulmonary:     Effort: Pulmonary effort is normal.     Breath sounds: No stridor. No wheezing, rhonchi or rales.  Abdominal:     General: Abdomen is flat.     Palpations: There is no mass.     Tenderness: There is no abdominal tenderness. There is no guarding.  Musculoskeletal:        General: No swelling or tenderness. Normal range of motion.     Cervical back: Neck supple.  Lymphadenopathy:     Cervical: No cervical adenopathy.  Skin:    General: Skin is warm.     Findings: No rash.  Neurological:     General: No focal deficit present.     Mental Status: She is alert.  Psychiatric:        Mood and Affect: Mood normal.  Behavior: Behavior normal.    Lab Results  Component Value Date   WBC 5.6 03/14/2021   HGB 13.8 03/14/2021   HCT 40.4 03/14/2021   PLT 219.0 03/14/2021   GLUCOSE 93 03/14/2021   CHOL 185 12/11/2019   TRIG 80.0 12/11/2019   HDL 75.80 12/11/2019   LDLCALC 93 12/11/2019   ALT 26 03/14/2021   AST 28 03/14/2021   NA 140 03/14/2021   K 3.9 03/14/2021   CL 102 03/14/2021   CREATININE 0.92 03/14/2021   BUN 16 03/14/2021   CO2 31 03/14/2021   TSH 1.93 03/14/2021    MM Digital Diagnostic Unilat L  Result Date: 01/02/2020 CLINICAL DATA:  Recall from screening mammography with tomosynthesis, calcifications involving the LEFT breast. EXAM: DIGITAL DIAGNOSTIC LEFT MAMMOGRAM WITH CAD COMPARISON:  Previous exam(s). ACR  Breast Density Category d: The breast tissue is extremely dense, which lowers the sensitivity of mammography. FINDINGS: Standard spot magnification CC and mediolateral views of the LEFT breast calcifications and a standard 2D full field mediolateral view of the LEFT breast were obtained. The calcifications in the LOWER breast identified on screening mammography have a tram track appearance on the spot magnification images and the full field mediolateral image, indicating arterial calcifications. No suspicious branching calcifications are identified. The full field mediolateral image was processed with CAD. IMPRESSION: Benign arterial calcifications involving the LOWER LEFT breast which account for the screening mammographic findings. RECOMMENDATION: Screening mammogram in one year.(Code:[id number]-01Y) I have discussed the findings and recommendations with the patient. If applicable, a reminder letter will be sent to the patient regarding the next appointment. BI-RADS CATEGORY  2: Benign. Electronically Signed   By: Hulan Saas M.D.   On: 01/02/2020 12:00    Assessment & Plan:   Jadelin was seen today for annual exam.  Diagnoses and all orders for this visit:  Routine general medical examination at a health care facility- Exam completed, recent labs reviewed, statin therapy is not indicated.  She refused vaccines today, cancer screenings are up-to-date, patient education was given.   I am having Leslie L. Horace "[name]" maintain her Vitamin D3 and Multiple Vitamins-Minerals (CENTRUM SILVER PO).  No orders of the defined types were placed in this encounter.    Follow-up: Return in about 1 year (around 03/30/2022).  Sanda Linger, MD

## 2021-03-30 NOTE — Patient Instructions (Signed)
Health Maintenance, Female Adopting a healthy lifestyle and getting preventive care are important in promoting health and wellness. Ask your health care provider about: The right schedule for you to have regular tests and exams. Things you can do on your own to prevent diseases and keep yourself healthy. What should I know about diet, weight, and exercise? Eat a healthy diet  Eat a diet that includes plenty of vegetables, fruits, low-fat dairy products, and lean protein. Do not eat a lot of foods that are high in solid fats, added sugars, or sodium.  Maintain a healthy weight Body mass index (BMI) is used to identify weight problems. It estimates body fat based on height and weight. Your health care provider can help determineyour BMI and help you achieve or maintain a healthy weight. Get regular exercise Get regular exercise. This is one of the most important things you can do for your health. Most adults should: Exercise for at least 150 minutes each week. The exercise should increase your heart rate and make you sweat (moderate-intensity exercise). Do strengthening exercises at least twice a week. This is in addition to the moderate-intensity exercise. Spend less time sitting. Even light physical activity can be beneficial. Watch cholesterol and blood lipids Have your blood tested for lipids and cholesterol at 66 years of age, then havethis test every 5 years. Have your cholesterol levels checked more often if: Your lipid or cholesterol levels are high. You are older than 66 years of age. You are at high risk for heart disease. What should I know about cancer screening? Depending on your health history and family history, you may need to have cancer screening at various ages. This may include screening for: Breast cancer. Cervical cancer. Colorectal cancer. Skin cancer. Lung cancer. What should I know about heart disease, diabetes, and high blood pressure? Blood pressure and heart  disease High blood pressure causes heart disease and increases the risk of stroke. This is more likely to develop in people who have high blood pressure readings, are of African descent, or are overweight. Have your blood pressure checked: Every 3-5 years if you are 18-39 years of age. Every year if you are 40 years old or older. Diabetes Have regular diabetes screenings. This checks your fasting blood sugar level. Have the screening done: Once every three years after age 40 if you are at a normal weight and have a low risk for diabetes. More often and at a younger age if you are overweight or have a high risk for diabetes. What should I know about preventing infection? Hepatitis B If you have a higher risk for hepatitis B, you should be screened for this virus. Talk with your health care provider to find out if you are at risk forhepatitis B infection. Hepatitis C Testing is recommended for: Everyone born from 1945 through 1965. Anyone with known risk factors for hepatitis C. Sexually transmitted infections (STIs) Get screened for STIs, including gonorrhea and chlamydia, if: You are sexually active and are younger than 66 years of age. You are older than 66 years of age and your health care provider tells you that you are at risk for this type of infection. Your sexual activity has changed since you were last screened, and you are at increased risk for chlamydia or gonorrhea. Ask your health care provider if you are at risk. Ask your health care provider about whether you are at high risk for HIV. Your health care provider may recommend a prescription medicine to help   prevent HIV infection. If you choose to take medicine to prevent HIV, you should first get tested for HIV. You should then be tested every 3 months for as long as you are taking the medicine. Pregnancy If you are about to stop having your period (premenopausal) and you may become pregnant, seek counseling before you get  pregnant. Take 400 to 800 micrograms (mcg) of folic acid every day if you become pregnant. Ask for birth control (contraception) if you want to prevent pregnancy. Osteoporosis and menopause Osteoporosis is a disease in which the bones lose minerals and strength with aging. This can result in bone fractures. If you are 65 years old or older, or if you are at risk for osteoporosis and fractures, ask your health care provider if you should: Be screened for bone loss. Take a calcium or vitamin D supplement to lower your risk of fractures. Be given hormone replacement therapy (HRT) to treat symptoms of menopause. Follow these instructions at home: Lifestyle Do not use any products that contain nicotine or tobacco, such as cigarettes, e-cigarettes, and chewing tobacco. If you need help quitting, ask your health care provider. Do not use street drugs. Do not share needles. Ask your health care provider for help if you need support or information about quitting drugs. Alcohol use Do not drink alcohol if: Your health care provider tells you not to drink. You are pregnant, may be pregnant, or are planning to become pregnant. If you drink alcohol: Limit how much you use to 0-1 drink a day. Limit intake if you are breastfeeding. Be aware of how much alcohol is in your drink. In the [place] one drink equals one 12 oz bottle of beer (355 mL), one 5 oz glass of wine (148 mL), or one 1 oz glass of hard liquor (44 mL). General instructions Schedule regular health, dental, and eye exams. Stay current with your vaccines. Tell your health care provider if: You often feel depressed. You have ever been abused or do not feel safe at home. Summary Adopting a healthy lifestyle and getting preventive care are important in promoting health and wellness. Follow your health care provider's instructions about healthy diet, exercising, and getting tested or screened for diseases. Follow your health care provider's  instructions on monitoring your cholesterol and blood pressure. This information is not intended to replace advice given to you by your health care provider. Make sure you discuss any questions you have with your healthcare provider. Document Revised: 08/28/2018 Document Reviewed: 08/28/2018 Elsevier Patient Education  2022 [employer] Inc[employer]  

## 2022-01-04 ENCOUNTER — Encounter: Payer: Self-pay | Admitting: Internal Medicine

## 2022-01-04 ENCOUNTER — Ambulatory Visit: Payer: BC Managed Care – PPO | Admitting: Internal Medicine

## 2022-01-04 ENCOUNTER — Telehealth: Payer: Self-pay | Admitting: Internal Medicine

## 2022-01-04 VITALS — BP 124/78 | HR 74 | Temp 98.5°F | Ht 68.0 in | Wt 109.0 lb

## 2022-01-04 DIAGNOSIS — G4451 Hemicrania continua: Secondary | ICD-10-CM | POA: Insufficient documentation

## 2022-01-04 DIAGNOSIS — Z0000 Encounter for general adult medical examination without abnormal findings: Secondary | ICD-10-CM

## 2022-01-04 LAB — CBC WITH DIFFERENTIAL/PLATELET
Basophils Absolute: 0 10*3/uL (ref 0.0–0.1)
Basophils Relative: 0.8 % (ref 0.0–3.0)
Eosinophils Absolute: 0.1 10*3/uL (ref 0.0–0.7)
Eosinophils Relative: 2 % (ref 0.0–5.0)
HCT: 40.8 % (ref 36.0–46.0)
Hemoglobin: 13.9 g/dL (ref 12.0–15.0)
Lymphocytes Relative: 28 % (ref 12.0–46.0)
Lymphs Abs: 1.7 10*3/uL (ref 0.7–4.0)
MCHC: 34 g/dL (ref 30.0–36.0)
MCV: 94.5 fl (ref 78.0–100.0)
Monocytes Absolute: 0.6 10*3/uL (ref 0.1–1.0)
Monocytes Relative: 9.9 % (ref 3.0–12.0)
Neutro Abs: 3.6 10*3/uL (ref 1.4–7.7)
Neutrophils Relative %: 59.3 % (ref 43.0–77.0)
Platelets: 233 10*3/uL (ref 150.0–400.0)
RBC: 4.31 Mil/uL (ref 3.87–5.11)
RDW: 12.7 % (ref 11.5–15.5)
WBC: 6.1 10*3/uL (ref 4.0–10.5)

## 2022-01-04 LAB — BASIC METABOLIC PANEL
BUN: 15 mg/dL (ref 6–23)
CO2: 30 mEq/L (ref 19–32)
Calcium: 9.6 mg/dL (ref 8.4–10.5)
Chloride: 103 mEq/L (ref 96–112)
Creatinine, Ser: 0.87 mg/dL (ref 0.40–1.20)
GFR: 69.45 mL/min (ref >60.00)
Glucose, Bld: 111 mg/dL — ABNORMAL HIGH (ref 70–99)
Potassium: 4.1 mEq/L (ref 3.5–5.1)
Sodium: 141 mEq/L (ref 135–145)

## 2022-01-04 LAB — HEPATIC FUNCTION PANEL
ALT: 21 U/L (ref 0–35)
AST: 23 U/L (ref 0–37)
Albumin: 4.4 g/dL (ref 3.5–5.2)
Alkaline Phosphatase: 60 U/L (ref 39–117)
Bilirubin, Direct: 0.1 mg/dL (ref 0.0–0.3)
Total Bilirubin: 0.5 mg/dL (ref 0.2–1.2)
Total Protein: 6.8 g/dL (ref 6.0–8.3)

## 2022-01-04 LAB — TSH: TSH: 1.49 u[IU]/mL (ref 0.35–5.50)

## 2022-01-04 LAB — C-REACTIVE PROTEIN: CRP: <1 mg/dL (ref 0.5–20.0)

## 2022-01-04 NOTE — Patient Instructions (Signed)
General Headache Without Cause A headache is pain or discomfort felt around the head or neck area. There are many causes and types of headaches. A few common types include: Tension headaches. Migraine headaches. Cluster headaches. Chronic daily headaches. Sometimes, the specific cause of a headache may not be found. Follow these instructions at home: Watch your condition for any changes. Let your health care provider know about them. Take these steps to help with your condition: Managing pain     Take over-the-counter and prescription medicines only as told by your health care provider. Treatment may include medicines for pain that are taken by mouth or applied to the skin. Lie down in a dark, quiet room when you have a headache. Keep lights dim if bright lights bother you or make your headaches worse. If directed, put ice on your head and neck area: Put ice in a plastic bag. Place a towel between your skin and the bag. Leave the ice on for 20 minutes, 2-3 times per day. Remove the ice if your skin turns bright red. This is very important. If you cannot feel pain, heat, or cold, you have a greater risk of damage to the area. If directed, apply heat to the affected area. Use the heat source that your health care provider recommends, such as a moist heat pack or a heating pad. Place a towel between your skin and the heat source. Leave the heat on for 20-30 minutes. Remove the heat if your skin turns bright red. This is especially important if you are unable to feel pain, heat, or cold. You have a greater risk of getting burned. Eating and drinking Eat meals on a regular schedule. If you drink alcohol: Limit how much you have to: 0-1 drink a day for women who are not pregnant. 0-2 drinks a day for men. Know how much alcohol is in a drink. In the [place] one drink equals one 12 oz bottle of beer (355 mL), one 5 oz glass of wine (148 mL), or one 1 oz glass of hard liquor (44 mL). Stop  drinking caffeine, or decrease the amount of caffeine you drink. Drink enough fluid to keep your urine pale yellow. General instructions  Keep a headache journal to help find out what may trigger your headaches. For example, write down: What you eat and drink. How much sleep you get. Any change to your diet or medicines. Try massage or other relaxation techniques. Limit stress. Sit up straight, and do not tense your muscles. Do not use any products that contain nicotine or tobacco. These products include cigarettes, chewing tobacco, and vaping devices, such as e-cigarettes. If you need help quitting, ask your health care provider. Exercise regularly as told by your health care provider. Sleep on a regular schedule. Get 7-9 hours of sleep each night, or the amount recommended by your health care provider. Keep all follow-up visits. This is important. Contact a health care provider if: Medicine does not help your symptoms. You have a headache that is different from your usual headache. You have nausea or you vomit. You have a fever. Get help right away if: Your headache: Becomes severe quickly. Gets worse after moderate to intense physical activity. You have any of these symptoms: Repeated vomiting. Pain or stiffness in your neck. Changes to your vision. Pain in an eye or ear. Problems with speech. Muscular weakness or loss of muscle control. Loss of balance or coordination. You feel faint or pass out. You have confusion. You have   a seizure. These symptoms may represent a serious problem that is an emergency. Do not wait to see if the symptoms will go away. Get medical help right away. Call your local emergency services (911 in the [place] Do not drive yourself to the hospital. Summary A headache is pain or discomfort felt around the head or neck area. There are many causes and types of headaches. In some cases, the cause may not be found. Keep a headache journal to help find out  what may trigger your headaches. Watch your condition for any changes. Let your health care provider know about them. Contact a health care provider if you have a headache that is different from the usual headache, or if your symptoms are not helped by medicine. Get help right away if your headache becomes severe, you vomit, you have a loss of vision, you lose your balance, or you have a seizure. This information is not intended to replace advice given to you by your health care provider. Make sure you discuss any questions you have with your health care provider. Document Revised: 02/02/2021 Document Reviewed: 02/02/2021 Elsevier Patient Education  2023 Elsevier Inc.  

## 2022-01-04 NOTE — Progress Notes (Signed)
? ?Subjective:  ?Patient ID: Leslie Cooper, female    DOB: Aug 20, 1956  Age: 66 y.o. MRN: [id number] ? ?CC: Headache ? ? ?HPI ?Leslie Cooper presents for f/up - ? ?She complains of a 2-week history of headache with nausea but no vomiting.  She has had mild ataxia, veering to the left.  She denies visual disturbance or paresthesias.  She has a history of Lyme disease. ? ?Outpatient Medications Prior to Visit  ?Medication Sig Dispense Refill  ? Cholecalciferol (VITAMIN D3) 1000 UNITS CAPS Take by mouth daily.      ? Multiple Vitamins-Minerals (CENTRUM SILVER PO) Take by mouth daily.      ? ?No facility-administered medications prior to visit.  ? ? ?ROS ?Review of Systems  ?Constitutional:  Negative for appetite change, chills, diaphoresis, fatigue and fever.  ?HENT: Negative.    ?Eyes: Negative.  Negative for visual disturbance.  ?Respiratory:  Negative for cough, chest tightness, shortness of breath and wheezing.   ?Cardiovascular:  Negative for chest pain, palpitations and leg swelling.  ?Gastrointestinal:  Positive for nausea. Negative for abdominal pain, blood in stool, constipation, diarrhea and vomiting.  ?Endocrine: Negative.   ?Genitourinary: Negative.  Negative for difficulty urinating.  ?Musculoskeletal:  Positive for gait problem. Negative for neck pain.  ?Skin: Negative.  Negative for color change and rash.  ?Neurological:  Positive for headaches. Negative for dizziness, speech difficulty, weakness and numbness.  ?Hematological:  Negative for adenopathy. Does not bruise/bleed easily.  ?Psychiatric/Behavioral: Negative.    ? ?Objective:  ?BP 124/78 (BP Location: Left Arm, Patient Position: Sitting, Cuff Size: Large)   Pulse 74   Temp 98.5 ?F (36.9 ?C) (Oral)   Ht 5\' 8"  (1.727 m)   Wt 109 lb (49.4 kg)   SpO2 98%   BMI 16.57 kg/m?  ? ?BP Readings from Last 3 Encounters:  ?01/04/22 124/78  ?03/30/21 122/62  ?03/14/21 118/68  ? ? ?Wt Readings from Last 3 Encounters:  ?01/04/22 109 lb (49.4 kg)   ?03/30/21 108 lb 9.6 oz (49.3 kg)  ?03/14/21 106 lb (48.1 kg)  ? ? ?Physical Exam ?Vitals reviewed.  ?Constitutional:   ?   General: She is not in acute distress. ?   Appearance: She is not ill-appearing, toxic-appearing or diaphoretic.  ?HENT:  ?   Nose: Nose normal.  ?   Mouth/Throat:  ?   Mouth: Mucous membranes are moist.  ?Eyes:  ?   General: No scleral icterus. ?   Extraocular Movements: Extraocular movements intact.  ?   Pupils: Pupils are equal, round, and reactive to light.  ?Cardiovascular:  ?   Rate and Rhythm: Normal rate and regular rhythm.  ?   Heart sounds: No murmur heard. ?Pulmonary:  ?   Breath sounds: No stridor. No wheezing, rhonchi or rales.  ?Abdominal:  ?   General: Abdomen is flat.  ?   Palpations: There is no mass.  ?   Tenderness: There is no abdominal tenderness. There is no guarding or rebound.  ?   Hernia: No hernia is present.  ?Musculoskeletal:     ?   General: Normal range of motion.  ?   Cervical back: Neck supple.  ?   Right lower leg: No edema.  ?   Left lower leg: No edema.  ?Lymphadenopathy:  ?   Cervical: No cervical adenopathy.  ?Skin: ?   General: Skin is warm and dry.  ?Neurological:  ?   General: No focal deficit present.  ?   Mental  Status: She is alert and oriented to person, place, and time. Mental status is at baseline.  ?   Cranial Nerves: Cranial nerves 2-12 are intact.  ?   Sensory: Sensation is intact.  ?   Motor: Motor function is intact.  ?   Coordination: Coordination is intact. Romberg sign negative. Coordination normal. Finger-Nose-Finger Test normal.  ?   Gait: Gait is intact. Gait normal.  ?   Deep Tendon Reflexes: Reflexes normal. Babinski sign absent on the right side. Babinski sign absent on the left side.  ?   Reflex Scores: ?     Tricep reflexes are 1+ on the right side and 1+ on the left side. ?     Bicep reflexes are 1+ on the right side and 1+ on the left side. ?     Brachioradialis reflexes are 1+ on the right side and 1+ on the left side. ?      Patellar reflexes are 1+ on the right side and 1+ on the left side. ?     Achilles reflexes are 1+ on the right side and 1+ on the left side. ? ? ?Lab Results  ?Component Value Date  ? WBC 6.1 01/04/2022  ? HGB 13.9 01/04/2022  ? HCT 40.8 01/04/2022  ? PLT 233.0 01/04/2022  ? GLUCOSE 111 (H) 01/04/2022  ? CHOL 185 12/11/2019  ? TRIG 80.0 12/11/2019  ? HDL 75.80 12/11/2019  ? LDLCALC 93 12/11/2019  ? ALT 21 01/04/2022  ? AST 23 01/04/2022  ? NA 141 01/04/2022  ? K 4.1 01/04/2022  ? CL 103 01/04/2022  ? CREATININE 0.87 01/04/2022  ? BUN 15 01/04/2022  ? CO2 30 01/04/2022  ? TSH 1.49 01/04/2022  ? ? ?MM Digital Diagnostic Unilat L ? ?Result Date: 01/02/2020 ?CLINICAL DATA:  Recall from screening mammography with tomosynthesis, calcifications involving the LEFT breast. EXAM: DIGITAL DIAGNOSTIC LEFT MAMMOGRAM WITH CAD COMPARISON:  Previous exam(s). ACR Breast Density Category d: The breast tissue is extremely dense, which lowers the sensitivity of mammography. FINDINGS: Standard spot magnification CC and mediolateral views of the LEFT breast calcifications and a standard 2D full field mediolateral view of the LEFT breast were obtained. The calcifications in the LOWER breast identified on screening mammography have a tram track appearance on the spot magnification images and the full field mediolateral image, indicating arterial calcifications. No suspicious branching calcifications are identified. The full field mediolateral image was processed with CAD. IMPRESSION: Benign arterial calcifications involving the LOWER LEFT breast which account for the screening mammographic findings. RECOMMENDATION: Screening mammogram in one year.(Code:SM-B-01Y) I have discussed the findings and recommendations with the patient. If applicable, a reminder letter will be sent to the patient regarding the next appointment. BI-RADS CATEGORY  2: Benign. Electronically Signed   By: Hulan Saashomas  Lawrence M.D.   On: 01/02/2020 12:00  ? ? ?Assessment &  Plan:  ? ?Leslie Cooper was seen today for headache. ? ?Diagnoses and all orders for this visit: ? ?Routine general medical examination at a health care facility ?-     Lipid panel; Future ? 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  Her headache sounds benign and her neuro exam is reassuring.  If the headache does not resolve soon then I will consider ordering an MRI of her brain to rule out structural lesion. ?-     Basic metabolic panel; Future ?-     TSH; Future ?-     Hepatic function panel; Future ?-     C-reactive protein;  Future ?-     B. burgdorfi antibodies by WB; Future ?-     CBC with Differential/Platelet; Future ?-     CBC with Differential/Platelet ?-     B. burgdorfi antibodies by WB ?-     C-reactive protein ?-     Hepatic function panel ?-     TSH ?-     Basic metabolic panel ? ? ?I am having Ionia L. Berrie "[name]" maintain her Vitamin D3 and Multiple Vitamins-Minerals (CENTRUM SILVER PO). ? ?No orders of the defined types were placed in this encounter. ? ? ? ?Follow-up: Return in about 4 weeks (around 02/01/2022). ? ?Sanda Linger, MD ?

## 2022-01-04 NOTE — Telephone Encounter (Signed)
Pt requesting an order for labs prior to cpe appt on 04-03-2022 ?

## 2022-01-07 LAB — B. BURGDORFI ANTIBODIES BY WB
B burgdorferi IgG Abs (IB): NEGATIVE
B burgdorferi IgM Abs (IB): NEGATIVE
Lyme Disease 18 kD IgG: NONREACTIVE
Lyme Disease 23 kD IgG: REACTIVE — AB
Lyme Disease 23 kD IgM: NONREACTIVE
Lyme Disease 28 kD IgG: NONREACTIVE
Lyme Disease 30 kD IgG: NONREACTIVE
Lyme Disease 39 kD IgG: NONREACTIVE
Lyme Disease 39 kD IgM: NONREACTIVE
Lyme Disease 41 kD IgG: REACTIVE — AB
Lyme Disease 41 kD IgM: NONREACTIVE
Lyme Disease 45 kD IgG: NONREACTIVE
Lyme Disease 58 kD IgG: NONREACTIVE
Lyme Disease 66 kD IgG: NONREACTIVE
Lyme Disease 93 kD IgG: NONREACTIVE

## 2022-01-09 ENCOUNTER — Encounter: Payer: Self-pay | Admitting: Internal Medicine

## 2022-03-28 ENCOUNTER — Other Ambulatory Visit (INDEPENDENT_AMBULATORY_CARE_PROVIDER_SITE_OTHER): Payer: BC Managed Care – PPO

## 2022-03-28 DIAGNOSIS — Z0000 Encounter for general adult medical examination without abnormal findings: Secondary | ICD-10-CM | POA: Diagnosis not present

## 2022-03-28 LAB — LIPID PANEL
Cholesterol: 200 mg/dL (ref 0–200)
HDL: 90.2 mg/dL
LDL Cholesterol: 95 mg/dL (ref 0–99)
NonHDL: 110.13
Total CHOL/HDL Ratio: 2
Triglycerides: 76 mg/dL (ref 0.0–149.0)
VLDL: 15.2 mg/dL (ref 0.0–40.0)

## 2022-04-03 ENCOUNTER — Ambulatory Visit (INDEPENDENT_AMBULATORY_CARE_PROVIDER_SITE_OTHER): Payer: BC Managed Care – PPO | Admitting: Internal Medicine

## 2022-04-03 ENCOUNTER — Encounter: Payer: Self-pay | Admitting: Internal Medicine

## 2022-04-03 ENCOUNTER — Ambulatory Visit: Payer: BC Managed Care – PPO | Admitting: Internal Medicine

## 2022-04-03 VITALS — BP 134/72 | HR 64 | Temp 97.9°F | Resp 16 | Ht 68.0 in | Wt 108.0 lb

## 2022-04-03 DIAGNOSIS — Z0000 Encounter for general adult medical examination without abnormal findings: Secondary | ICD-10-CM

## 2022-04-03 NOTE — Progress Notes (Unsigned)
Subjective:  Patient ID: Leslie Cooper, female    DOB: [birth year]  Age: 66 y.o. MRN: [id number]  CC: Annual Exam   HPI Leslie Cooper presents for a CPX.  She is active and denies chest pain, shortness of breath, dizziness, lightheadedness, or palpitations.  Outpatient Medications Prior to Visit  Medication Sig Dispense Refill   Cholecalciferol (VITAMIN D3) 1000 UNITS CAPS Take by mouth daily.       Multiple Vitamins-Minerals (CENTRUM SILVER PO) Take by mouth daily.       No facility-administered medications prior to visit.    ROS Review of Systems  Constitutional:  Negative for chills, fatigue and fever.  HENT: Negative.    Eyes: Negative.   Respiratory:  Negative for cough, chest tightness and wheezing.   Cardiovascular:  Negative for chest pain, palpitations and leg swelling.  Gastrointestinal:  Negative for abdominal pain.  Endocrine: Negative.   Genitourinary: Negative.   Musculoskeletal:  Positive for arthralgias. Negative for gait problem and myalgias.  Skin:  Negative for rash.  Neurological: Negative.  Negative for weakness.  Hematological:  Negative for adenopathy. Does not bruise/bleed easily.  Psychiatric/Behavioral: Negative.      Objective:  BP 134/72 (BP Location: Right Arm, Patient Position: Sitting, Cuff Size: Large)   Pulse 64   Temp 97.9 F (36.6 C) (Oral)   Resp 16   Ht 5\' 8"  (1.727 m)   Wt 108 lb (49 kg)   SpO2 99%   BMI 16.42 kg/m   BP Readings from Last 3 Encounters:  04/03/22 134/72  01/04/22 124/78  03/30/21 122/62    Wt Readings from Last 3 Encounters:  04/03/22 108 lb (49 kg)  01/04/22 109 lb (49.4 kg)  03/30/21 108 lb 9.6 oz (49.3 kg)    Physical Exam Vitals reviewed.  HENT:     Nose: Nose normal.     Mouth/Throat:     Mouth: Mucous membranes are moist.  Eyes:     General: No scleral icterus.    Conjunctiva/sclera: Conjunctivae normal.  Cardiovascular:     Rate and Rhythm: Normal rate and regular rhythm.      Heart sounds: No murmur heard. Pulmonary:     Effort: Pulmonary effort is normal.     Breath sounds: No stridor. No wheezing, rhonchi or rales.  Abdominal:     Palpations: There is no mass.     Tenderness: There is no abdominal tenderness. There is no guarding.     Hernia: No hernia is present.  Musculoskeletal:        General: Normal range of motion.     Cervical back: Neck supple.     Right lower leg: No edema.  Lymphadenopathy:     Cervical: No cervical adenopathy.  Skin:    General: Skin is warm and dry.     Findings: No rash.  Neurological:     General: No focal deficit present.     Mental Status: She is alert. Mental status is at baseline.  Psychiatric:        Mood and Affect: Mood normal.        Behavior: Behavior normal.     Lab Results  Component Value Date   WBC 6.1 01/04/2022   HGB 13.9 01/04/2022   HCT 40.8 01/04/2022   PLT 233.0 01/04/2022   GLUCOSE 111 (H) 01/04/2022   CHOL 200 03/28/2022   TRIG 76.0 03/28/2022   HDL 90.20 03/28/2022   LDLCALC 95 03/28/2022   ALT 21 01/04/2022  AST 23 01/04/2022   NA 141 01/04/2022   K 4.1 01/04/2022   CL 103 01/04/2022   CREATININE 0.87 01/04/2022   BUN 15 01/04/2022   CO2 30 01/04/2022   TSH 1.49 01/04/2022    MM Digital Diagnostic Unilat L  Result Date: 01/02/2020 CLINICAL DATA:  Recall from screening mammography with tomosynthesis, calcifications involving the LEFT breast. EXAM: DIGITAL DIAGNOSTIC LEFT MAMMOGRAM WITH CAD COMPARISON:  Previous exam(s). ACR Breast Density Category d: The breast tissue is extremely dense, which lowers the sensitivity of mammography. FINDINGS: Standard spot magnification CC and mediolateral views of the LEFT breast calcifications and a standard 2D full field mediolateral view of the LEFT breast were obtained. The calcifications in the LOWER breast identified on screening mammography have a tram track appearance on the spot magnification images and the full field mediolateral image,  indicating arterial calcifications. No suspicious branching calcifications are identified. The full field mediolateral image was processed with CAD. IMPRESSION: Benign arterial calcifications involving the LOWER LEFT breast which account for the screening mammographic findings. RECOMMENDATION: Screening mammogram in one year.(Code:SM-B-01Y) I have discussed the findings and recommendations with the patient. If applicable, a reminder letter will be sent to the patient regarding the next appointment. BI-RADS CATEGORY  2: Benign. Electronically Signed   By: Hulan Saas M.D.   On: 01/02/2020 12:00    Assessment & Plan:   Leslie Cooper was seen today for annual exam.  Diagnoses and all orders for this visit:  Routine general medical examination at a health care facility- Exam completed, labs reviewed-statin therapy is not indicated, she refused pneumonia vaccine and shingles vaccines, cancer screenings are up-to-date, patient education was given.   I am having Leslie Cooper "Leslie Cooper" maintain her Vitamin D3 and Multiple Vitamins-Minerals (CENTRUM SILVER PO).  No orders of the defined types were placed in this encounter.    Follow-up: No follow-ups on file.  Sanda Linger, MD

## 2022-04-03 NOTE — Patient Instructions (Signed)

## 2022-07-12 ENCOUNTER — Ambulatory Visit: Payer: BC Managed Care – PPO | Admitting: Internal Medicine

## 2022-07-12 ENCOUNTER — Encounter: Payer: Self-pay | Admitting: Internal Medicine

## 2022-07-12 DIAGNOSIS — H6690 Otitis media, unspecified, unspecified ear: Secondary | ICD-10-CM | POA: Diagnosis not present

## 2022-07-12 MED ORDER — OXYMETAZOLINE HCL 0.05 % NA SOLN: 1.0000 | Freq: Two times a day (BID) | NASAL | 0 refills | Status: DC

## 2022-07-12 MED ORDER — CEFDINIR 300 MG PO CAPS: 300.0000 mg | ORAL_CAPSULE | Freq: Two times a day (BID) | ORAL | 0 refills | Status: DC

## 2022-07-12 MED ORDER — METHYLPREDNISOLONE 4 MG PO TBPK: ORAL_TABLET | ORAL | 0 refills | Status: DC

## 2022-07-12 NOTE — Progress Notes (Signed)
Subjective:  Patient ID: Leslie Cooper, female    DOB: 1956-09-13  Age: 66 y.o. MRN: [id number]  CC: Hearing Problem (Pt states everything is extremely loud)   HPI Leslie Cooper presents for ear congestion, loud noises x 4 weeks   Outpatient Medications Prior to Visit  Medication Sig Dispense Refill   Cholecalciferol (VITAMIN D3) 1000 UNITS CAPS Take by mouth daily.       Multiple Vitamins-Minerals (CENTRUM SILVER PO) Take by mouth daily.       No facility-administered medications prior to visit.    ROS: Review of Systems  Constitutional:  Negative for activity change, appetite change, chills, fatigue and unexpected weight change.  HENT:  Negative for congestion, mouth sores and sinus pressure.   Eyes:  Negative for visual disturbance.  Respiratory:  Negative for cough and chest tightness.   Gastrointestinal:  Negative for abdominal pain and nausea.  Genitourinary:  Negative for difficulty urinating, frequency and vaginal pain.  Musculoskeletal:  Negative for back pain and gait problem.  Skin:  Negative for pallor and rash.  Neurological:  Negative for dizziness, tremors, weakness, numbness and headaches.  Psychiatric/Behavioral:  Negative for confusion, sleep disturbance and suicidal ideas.     Objective:  BP 118/68 (BP Location: Left Arm)   Pulse 64   Temp 98.7 F (37.1 C) (Oral)   Ht 5\' 8"  (1.727 m)   Wt 108 lb 12.8 oz (49.4 kg)   SpO2 97%   BMI 16.54 kg/m   BP Readings from Last 3 Encounters:  07/12/22 118/68  04/03/22 134/72  01/04/22 124/78    Wt Readings from Last 3 Encounters:  07/12/22 108 lb 12.8 oz (49.4 kg)  04/03/22 108 lb (49 kg)  01/04/22 109 lb (49.4 kg)    Physical Exam Constitutional:      General: She is not in acute distress.    Appearance: She is well-developed. She is obese.  HENT:     Head: Normocephalic.     Right Ear: External ear normal.     Left Ear: External ear normal.     Nose: Nose normal.  Eyes:      General:        Right eye: No discharge.        Left eye: No discharge.     Conjunctiva/sclera: Conjunctivae normal.     Pupils: Pupils are equal, round, and reactive to light.  Neck:     Thyroid: No thyromegaly.     Vascular: No JVD.     Trachea: No tracheal deviation.  Cardiovascular:     Rate and Rhythm: Normal rate and regular rhythm.     Heart sounds: Normal heart sounds.  Pulmonary:     Effort: No respiratory distress.     Breath sounds: No stridor. No wheezing.  Abdominal:     General: Bowel sounds are normal. There is no distension.     Palpations: Abdomen is soft. There is no mass.     Tenderness: There is no abdominal tenderness. There is no guarding or rebound.  Musculoskeletal:        General: No tenderness.     Cervical back: Normal range of motion and neck supple. No rigidity.  Lymphadenopathy:     Cervical: No cervical adenopathy.  Skin:    Findings: No erythema or rash.  Neurological:     Cranial Nerves: No cranial nerve deficit.     Motor: No abnormal muscle tone.     Coordination: Coordination normal.  Deep Tendon Reflexes: Reflexes normal.  Psychiatric:        Behavior: Behavior normal.        Thought Content: Thought content normal.        Judgment: Judgment normal.   fluid behind TM B  Lab Results  Component Value Date   WBC 6.1 01/04/2022   HGB 13.9 01/04/2022   HCT 40.8 01/04/2022   PLT 233.0 01/04/2022   GLUCOSE 111 (H) 01/04/2022   CHOL 200 03/28/2022   TRIG 76.0 03/28/2022   HDL 90.20 03/28/2022   LDLCALC 95 03/28/2022   ALT 21 01/04/2022   AST 23 01/04/2022   NA 141 01/04/2022   K 4.1 01/04/2022   CL 103 01/04/2022   CREATININE 0.87 01/04/2022   BUN 15 01/04/2022   CO2 30 01/04/2022   TSH 1.49 01/04/2022    MM Digital Diagnostic Unilat L  Result Date: 01/02/2020 CLINICAL DATA:  Recall from screening mammography with tomosynthesis, calcifications involving the LEFT breast. EXAM: DIGITAL DIAGNOSTIC LEFT MAMMOGRAM WITH CAD  COMPARISON:  Previous exam(s). ACR Breast Density Category d: The breast tissue is extremely dense, which lowers the sensitivity of mammography. FINDINGS: Standard spot magnification CC and mediolateral views of the LEFT breast calcifications and a standard 2D full field mediolateral view of the LEFT breast were obtained. The calcifications in the LOWER breast identified on screening mammography have a tram track appearance on the spot magnification images and the full field mediolateral image, indicating arterial calcifications. No suspicious branching calcifications are identified. The full field mediolateral image was processed with CAD. IMPRESSION: Benign arterial calcifications involving the LOWER LEFT breast which account for the screening mammographic findings. RECOMMENDATION: Screening mammogram in one year.(Code:SM-B-01Y) I have discussed the findings and recommendations with the patient. If applicable, a reminder letter will be sent to the patient regarding the next appointment. BI-RADS CATEGORY  2: Benign. Electronically Signed   By: Evangeline Dakin M.D.   On: 01/02/2020 12:00    Assessment & Plan:   Problem List Items Addressed This Visit     Otitis media    New B OM Omnicef Afrin Medrol pack Flonase ENT ref if not better      Relevant Medications   cefdinir (OMNICEF) 300 MG capsule      Meds ordered this encounter  Medications   methylPREDNISolone (MEDROL DOSEPAK) 4 MG TBPK tablet    Sig: As directed    Dispense:  21 tablet    Refill:  0   cefdinir (OMNICEF) 300 MG capsule    Sig: Take 1 capsule (300 mg total) by mouth 2 (two) times daily.    Dispense:  20 capsule    Refill:  0   oxymetazoline (AFRIN NASAL SPRAY) 0.05 % nasal spray    Sig: Place 1 spray into both nostrils 2 (two) times daily.    Dispense:  30 mL    Refill:  0      Follow-up: Return in about 4 months (around 11/12/2022) for Wellness Exam.  Walker Kehr, MD

## 2022-07-12 NOTE — Assessment & Plan Note (Signed)
New B OM Omnicef Afrin Medrol pack Flonase ENT ref if not better

## 2022-09-01 ENCOUNTER — Encounter: Payer: Self-pay | Admitting: Internal Medicine

## 2023-06-30 ENCOUNTER — Encounter: Payer: Self-pay | Admitting: Internal Medicine

## 2023-07-05 ENCOUNTER — Ambulatory Visit: Payer: BC Managed Care – PPO | Admitting: Internal Medicine

## 2023-07-05 ENCOUNTER — Encounter: Payer: Self-pay | Admitting: Internal Medicine

## 2023-07-05 VITALS — BP 112/62 | HR 74 | Temp 98.4°F | Ht 68.0 in | Wt 104.6 lb

## 2023-07-05 DIAGNOSIS — Z1211 Encounter for screening for malignant neoplasm of colon: Secondary | ICD-10-CM

## 2023-07-05 DIAGNOSIS — M810 Age-related osteoporosis without current pathological fracture: Secondary | ICD-10-CM | POA: Diagnosis not present

## 2023-07-05 DIAGNOSIS — Z0000 Encounter for general adult medical examination without abnormal findings: Secondary | ICD-10-CM

## 2023-07-05 NOTE — Patient Instructions (Signed)
Please continue all other medications as before, and refills have been done if requested.  Please have the pharmacy call with any other refills you may need.  Please continue your efforts at being more active, low cholesterol diet, and weight control.  You are otherwise up to date with prevention measures today.  Please keep your appointments with your specialists as you may have planned  You will be contacted regarding the referral for: cologuard  Please go to the LAB at the blood drawing area for the tests to be done - at the [facility] lab at your convenience  You will be contacted by phone if any changes need to be made immediately.  Otherwise, you will receive a letter about your results with an explanation, but please check with MyChart first.  Please make an Appointment to return for your 1 year visit, or sooner if needed, to Dr Yetta Barre

## 2023-07-05 NOTE — Progress Notes (Signed)
Patient ID: [name], female   DOB: 05-24-1956, 67 y.o.   MRN: [id number]         Chief Complaint:: wellness exam and Follow-up (PT requesting referral to GI for routine colonoscopy. PT had worked with [facility] in the past but had not seen them in over 10 years)       HPI:  Leslie Cooper is a 67 y.o. female here for wellness exam as her insurance is ending oct 31 and unable for PCP appt, hoping for colonoscopy as well prior to oct 31 but willing for cologuard, declines all imunizations, plans to have mammogram soon. O/w up to date               Also Pt denies chest pain, increased sob or doe, wheezing, orthopnea, PND, increased LE swelling, palpitations, dizziness or syncope.   Pt denies polydipsia, polyuria, or new focal neuro s/s.    Pt denies fever, wt loss, night sweats, loss of appetite, or other constitutional symptoms     Wt Readings from Last 3 Encounters:  07/05/23 104 lb 9.6 oz (47.4 kg)  07/12/22 108 lb 12.8 oz (49.4 kg)  04/03/22 108 lb (49 kg)   BP Readings from Last 3 Encounters:  07/05/23 112/62  07/12/22 118/68  04/03/22 134/72   Immunization History  Administered Date(s) Administered   Tdap 09/19/2007, 12/11/2019   Health Maintenance Due  Topic Date Due   Zoster Vaccines- Shingrix (1 of 2) Never done   Pneumonia Vaccine 37+ Years old (1 of 1 - PCV) Never done   Fecal DNA (Cologuard)  01/31/2023   MAMMOGRAM  03/31/2023   INFLUENZA VACCINE  Never done      Past Medical History:  Diagnosis Date   Osteoporosis 2012   History reviewed. No pertinent surgical history.  reports that she has never smoked. She has never used smokeless tobacco. She reports current alcohol use of about 1.0 standard drink of alcohol per week. She reports that she does not use drugs. family history is not on file. Allergies  Allergen Reactions   Sudafed [Pseudoephedrine]     gittery   Thimerosal (Thiomersal)     REACTION: Eye reaction; also no flu shots due to  thimersol preservative   Current Outpatient Medications on File Prior to Visit  Medication Sig Dispense Refill   Cholecalciferol (VITAMIN D3) 1000 UNITS CAPS Take by mouth daily.       Multiple Vitamins-Minerals (CENTRUM SILVER PO) Take by mouth daily.       methylPREDNISolone (MEDROL DOSEPAK) 4 MG TBPK tablet As directed 21 tablet 0   oxymetazoline (AFRIN NASAL SPRAY) 0.05 % nasal spray Place 1 spray into both nostrils 2 (two) times daily. 30 mL 0   No current facility-administered medications on file prior to visit.        ROS:  All others reviewed and negative.  Objective        PE:  BP 112/62   Pulse 74   Temp 98.4 F (36.9 C) (Oral)   Ht 5\' 8"  (1.727 m)   Wt 104 lb 9.6 oz (47.4 kg)   SpO2 99%   BMI 15.90 kg/m                 Constitutional: Pt appears in NAD               HENT: Head: NCAT.                Right Ear: External ear  normal.                 Left Ear: External ear normal.                Eyes: . Pupils are equal, round, and reactive to light. Conjunctivae and EOM are normal               Nose: without d/c or deformity               Neck: Neck supple. Gross normal ROM               Cardiovascular: Normal rate and regular rhythm.                 Pulmonary/Chest: Effort normal and breath sounds without rales or wheezing.                Abd:  Soft, NT, ND, + BS, no organomegaly               Neurological: Pt is alert. At baseline orientation, motor grossly intact               Skin: Skin is warm. No rashes, no other new lesions, LE edema - none               Psychiatric: Pt behavior is normal without agitation   Micro: none  Cardiac tracings I have personally interpreted today:  none  Pertinent Radiological findings (summarize): none   Lab Results  Component Value Date   WBC 6.1 01/04/2022   HGB 13.9 01/04/2022   HCT 40.8 01/04/2022   PLT 233.0 01/04/2022   GLUCOSE 111 (H) 01/04/2022   CHOL 200 03/28/2022   TRIG 76.0 03/28/2022   HDL 90.20 03/28/2022    LDLCALC 95 03/28/2022   ALT 21 01/04/2022   AST 23 01/04/2022   NA 141 01/04/2022   K 4.1 01/04/2022   CL 103 01/04/2022   CREATININE 0.87 01/04/2022   BUN 15 01/04/2022   CO2 30 01/04/2022   TSH 1.49 01/04/2022   Assessment/Plan:  Leslie Cooper is a 67 y.o. White or Caucasian [1] female with  has a past medical history of Osteoporosis (2012).  Routine general medical examination at a health care facility Age and sex appropriate education and counseling updated with regular exercise and diet Referrals for preventative services - for cologuard Immunizations addressed - declines all immunizations Smoking counseling  - none needed Evidence for depression or other mood disorder - none significant Most recent labs reviewed. I have personally reviewed and have noted: 1) the patient's medical and social history 2) The patient's current medications and supplements 3) The patient's height, weight, and BMI have been recorded in the chart  Followup: Return in about 1 year (around 07/04/2024).  Leslie Barre, MD 07/08/2023 6:54 PM [facility]

## 2023-07-08 ENCOUNTER — Encounter: Payer: Self-pay | Admitting: Internal Medicine

## 2023-07-08 NOTE — Assessment & Plan Note (Signed)
Age and sex appropriate education and counseling updated with regular exercise and diet Referrals for preventative services - for cologuard Immunizations addressed - declines all immunizations Smoking counseling  - none needed Evidence for depression or other mood disorder - none significant Most recent labs reviewed. I have personally reviewed and have noted: 1) the patient's medical and social history 2) The patient's current medications and supplements 3) The patient's height, weight, and BMI have been recorded in the chart

## 2023-07-09 ENCOUNTER — Telehealth: Payer: Self-pay | Admitting: Internal Medicine

## 2023-07-09 NOTE — Telephone Encounter (Signed)
Patient called to check on the status of the advise request. Their procedure is tomorrow 07/10/23 at noon. Patient would like a call back at [phone].

## 2023-07-09 NOTE — Telephone Encounter (Signed)
No sorry, this is no longer recommended, though it has been recommended in the past.  No need for antibix at this time given the most recent guidelines and recommendations.  Ok for procedure tomorrow as planned

## 2023-07-09 NOTE — Telephone Encounter (Signed)
Please advise 

## 2023-07-09 NOTE — Telephone Encounter (Signed)
Patient is having a tooth pulled tomorrow - she has previously had to have an antibiotic due to mitral valve prolapse.  Does she need one to get a tooth pulled.  If so please call one in  to [facility] on [address] in [place], [place].  Please call patient at [phone] and let her know because her dental appointment is tomorrow around noon .

## 2023-07-10 NOTE — Telephone Encounter (Signed)
Called and let Pt know

## 2023-07-11 ENCOUNTER — Other Ambulatory Visit (INDEPENDENT_AMBULATORY_CARE_PROVIDER_SITE_OTHER): Payer: BC Managed Care – PPO

## 2023-07-11 DIAGNOSIS — Z0000 Encounter for general adult medical examination without abnormal findings: Secondary | ICD-10-CM

## 2023-07-11 LAB — URINALYSIS, ROUTINE W REFLEX MICROSCOPIC
Bilirubin Urine: NEGATIVE
Hgb urine dipstick: NEGATIVE
Ketones, ur: NEGATIVE
Nitrite: NEGATIVE
RBC / HPF: NONE SEEN (ref 0–?)
Specific Gravity, Urine: <=1.005 — AB (ref 1.000–1.030)
Total Protein, Urine: NEGATIVE
Urine Glucose: NEGATIVE
Urobilinogen, UA: 0.2 (ref 0.0–1.0)
pH: 6.5 (ref 5.0–8.0)

## 2023-07-11 LAB — BASIC METABOLIC PANEL
BUN: 15 mg/dL (ref 6–23)
CO2: 31 meq/L (ref 19–32)
Calcium: 9.4 mg/dL (ref 8.4–10.5)
Chloride: 101 meq/L (ref 96–112)
Creatinine, Ser: 0.81 mg/dL (ref 0.40–1.20)
GFR: 74.87 mL/min (ref >60.00)
Glucose, Bld: 96 mg/dL (ref 70–99)
Potassium: 3.7 meq/L (ref 3.5–5.1)
Sodium: 138 meq/L (ref 135–145)

## 2023-07-11 LAB — HEPATIC FUNCTION PANEL
ALT: 21 U/L (ref 0–35)
AST: 23 U/L (ref 0–37)
Albumin: 4.3 g/dL (ref 3.5–5.2)
Alkaline Phosphatase: 61 U/L (ref 39–117)
Bilirubin, Direct: 0.1 mg/dL (ref 0.0–0.3)
Total Bilirubin: 0.8 mg/dL (ref 0.2–1.2)
Total Protein: 6.6 g/dL (ref 6.0–8.3)

## 2023-07-11 LAB — CBC WITH DIFFERENTIAL/PLATELET
Basophils Absolute: 0 10*3/uL (ref 0.0–0.1)
Basophils Relative: 1 % (ref 0.0–3.0)
Eosinophils Absolute: 0.1 10*3/uL (ref 0.0–0.7)
Eosinophils Relative: 2.9 % (ref 0.0–5.0)
HCT: 43.4 % (ref 36.0–46.0)
Hemoglobin: 14.2 g/dL (ref 12.0–15.0)
Lymphocytes Relative: 32.9 % (ref 12.0–46.0)
Lymphs Abs: 1.6 10*3/uL (ref 0.7–4.0)
MCHC: 32.8 g/dL (ref 30.0–36.0)
MCV: 95.5 fL (ref 78.0–100.0)
Monocytes Absolute: 0.6 10*3/uL (ref 0.1–1.0)
Monocytes Relative: 11.5 % (ref 3.0–12.0)
Neutro Abs: 2.6 10*3/uL (ref 1.4–7.7)
Neutrophils Relative %: 51.7 % (ref 43.0–77.0)
Platelets: 242 10*3/uL (ref 150.0–400.0)
RBC: 4.54 Mil/uL (ref 3.87–5.11)
RDW: 13 % (ref 11.5–15.5)
WBC: 4.9 10*3/uL (ref 4.0–10.5)

## 2023-07-11 LAB — LIPID PANEL
Cholesterol: 187 mg/dL (ref 0–200)
HDL: 83 mg/dL (ref >39.00)
LDL Cholesterol: 92 mg/dL (ref 0–99)
NonHDL: 104.37
Total CHOL/HDL Ratio: 2
Triglycerides: 63 mg/dL (ref 0.0–149.0)
VLDL: 12.6 mg/dL (ref 0.0–40.0)

## 2023-07-11 LAB — TSH: TSH: 1.26 u[IU]/mL (ref 0.35–5.50)

## 2023-07-11 NOTE — Progress Notes (Signed)
The test results show that your current treatment is OK, as the tests are stable.  Please continue the same plan.  There is no other need for change of treatment or further evaluation based on these results, at this time.  thanks 

## 2023-07-18 LAB — COLOGUARD: COLOGUARD: NEGATIVE

## 2023-10-15 ENCOUNTER — Ambulatory Visit: Payer: Self-pay | Admitting: Internal Medicine

## 2023-10-15 NOTE — Telephone Encounter (Signed)
  Chief Complaint: wrist injury Symptoms: pain in right wrist, swelling at wrist Frequency: since January 11th Pertinent Negatives: Patient denies inability to move hand/wrist,  Disposition: [] ED /[] Urgent Care (no appt availability in office) / [x] Appointment(In office/virtual)/ []  [facility]/ [] Home Care/ [] Refused Recommended Disposition /[] [facility]/ []  Follow-up with PCP Additional Notes: Patient reports she injured her right wrist on 1/11. Patient denies any bruising. Patient reports she is still able to move her hand, but states it is moderately painful when she twists her wrist a certain way. Patient reports she notices mild swelling around only the wrist area. Per protocol, this RN advised in office appt. Patient reports she will not have insurance until 2/1 and is only able to be seen after that date. Appt scheduled 2/3 at patients request. This RN advised patient that if symptoms worsen she should call and make a sooner appt or go to UC to be evaluated prior. Patient verbalized understanding.     Copied from CRM [id number]. Topic: Clinical - Pink Word Triage >> Oct 15, 2023 10:11 AM Samuel Jester B wrote: Reason for Triage: Pt stated that she may have strained or broke her wrist. Pt stated that it hurts below the thumb area, and swollen Reason for Disposition  [1] After 2 weeks AND [2] still painful  Answer Assessment - Initial Assessment Questions 1. MECHANISM: "How did the injury happen?"      Slipped and landed on right wrist 2. WHEN: "When did the injury happen?" (Minutes or hours ago)      January 11th 3. LOCATION: "Which wrist or hand is injured?"     Right wrist 4. APPEARANCE of INJURY: "What does the injury look like?"      swollen 5. SEVERITY: "Can your child move the wrist or hand normally?" For wrist, can rotate palm up and down and move the hand up and down (wrist flexion/extension). For hand, can make a fist and open it straight.     Hurts to twist  it 6. SIZE: For bruises or swelling, ask: "How large is it?" (Inches or centimeters)      Entire wrist 7. PAIN: "Is there pain?" If so, ask: "How bad is the pain?"      Is I twist it it's moderate 6/10  Protocols used: Wrist or Hand Injury-P-AH

## 2023-10-22 ENCOUNTER — Ambulatory Visit (INDEPENDENT_AMBULATORY_CARE_PROVIDER_SITE_OTHER): Payer: Medicare Other | Admitting: Internal Medicine

## 2023-10-22 ENCOUNTER — Ambulatory Visit (INDEPENDENT_AMBULATORY_CARE_PROVIDER_SITE_OTHER)
Admission: RE | Admit: 2023-10-22 | Discharge: 2023-10-22 | Disposition: A | Discharge status: Home / Self Care | Payer: Medicare Other | Source: Ambulatory Visit | Attending: Internal Medicine | Admitting: Internal Medicine

## 2023-10-22 ENCOUNTER — Encounter: Payer: Self-pay | Admitting: Internal Medicine

## 2023-10-22 VITALS — BP 114/72 | HR 57 | Temp 98.1°F | Ht 68.0 in | Wt 110.0 lb

## 2023-10-22 DIAGNOSIS — M25531 Pain in right wrist: Secondary | ICD-10-CM

## 2023-10-22 NOTE — Progress Notes (Signed)
 LILLETTE Ileana Collet, PhD, LAT, ATC acting as a scribe for Artist Lloyd, MD.  Leslie Cooper is a 68 y.o. female who presents to [facility] today for R wrist pain. On Jan 10th, she fell on the ice falling to the R. Pt locates pain to 2nd-3rd [facility] and into the radial aspect of her R wrist.   She does have a history of osteoporosis.  She took Fosamax in the past for about a week and had significant GI intolerance and is not taking any other medication.  She does do daily weightbearing exercises and typically does daily resistance training.  She is currently taking vitamin D .  Treatments tried: wrist sleeve  Dx imaging: 10/22/23 R hand & wrist XR  12/16/19 DEXA scan  Pertinent review of systems: No fevers or chills  Relevant historical information: Osteoporosis   Exam:  BP 114/72   Pulse (!) 57   Ht 5' 8 (1.727 m)   Wt 110 lb (49.9 kg)   SpO2 100%   BMI 16.73 kg/m  General: Well Developed, well nourished, and in no acute distress.   MSK: Right wrist no significant swelling.  Minimally tender palpation at distal radius.  Normal wrist motion. Pulses cap refill and sensation are intact distally.    Lab and Radiology Results No results found for this or any previous visit (from the past 72 hours). DG Hand Complete Right Result Date: 10/22/2023 CLINICAL DATA:  Fall 09/28/2023.  Right wrist and hand pain. EXAM: RIGHT HAND - COMPLETE 3+ VIEW; RIGHT WRIST - COMPLETE 3+ VIEW COMPARISON:  None Available. FINDINGS: There is diffuse decreased bone mineralization. Right wrist: There is oblique linear lucency within the distal radius extending from the lateral cortex of the distal radial metaphysis distally and medial/ulnar early to the mid to slightly lateral aspect of the distal radial articular surface. Up to approximately 2 mm craniocaudal diastasis at the lateral aspect of the fracture without diastasis at the medial aspect of the fracture. Mild peripheral  healing fracture line sclerosis and early bone resorption. Minimal thumb metacarpal degenerative spurring. Severe triscaphe joint space narrowing bone-on-bone contact and subchondral sclerosis. Right hand: Minimal distal lateral phalangeal degenerative spurring. Oblique view there is a 2 mm oval lucency within the dorsomedial aspect of the distal portion of the middle phalanx of the index finger, and well-circumscribed possible erosion. IMPRESSION: 1. Subacute healing intra-articular fracture of the distal radius. Up to 2 mm diastasis at the lateral distal radial metaphyseal cortex but otherwise no significant displacement. 2. Severe triscaphe osteoarthritis. 3. There is a 2 mm oval lucency within the dorsomedial aspect of the distal portion of the middle phalanx of the index finger, and well-circumscribed possible erosion. This is nonspecific but can be seen with inflammatory arthropathy. Electronically Signed   By: Tanda Lyons M.D.   On: 10/22/2023 10:22   DG Wrist Complete Right Result Date: 10/22/2023 CLINICAL DATA:  Fall 09/28/2023.  Right wrist and hand pain. EXAM: RIGHT HAND - COMPLETE 3+ VIEW; RIGHT WRIST - COMPLETE 3+ VIEW COMPARISON:  None Available. FINDINGS: There is diffuse decreased bone mineralization. Right wrist: There is oblique linear lucency within the distal radius extending from the lateral cortex of the distal radial metaphysis distally and medial/ulnar early to the mid to slightly lateral aspect of the distal radial articular surface. Up to approximately 2 mm craniocaudal diastasis at the lateral aspect of the fracture without diastasis at the medial aspect of the fracture. Mild peripheral healing fracture  line sclerosis and early bone resorption. Minimal thumb metacarpal degenerative spurring. Severe triscaphe joint space narrowing bone-on-bone contact and subchondral sclerosis. Right hand: Minimal distal lateral phalangeal degenerative spurring. Oblique view there is a 2 mm oval lucency  within the dorsomedial aspect of the distal portion of the middle phalanx of the index finger, and well-circumscribed possible erosion. IMPRESSION: 1. Subacute healing intra-articular fracture of the distal radius. Up to 2 mm diastasis at the lateral distal radial metaphyseal cortex but otherwise no significant displacement. 2. Severe triscaphe osteoarthritis. 3. There is a 2 mm oval lucency within the dorsomedial aspect of the distal portion of the middle phalanx of the index finger, and well-circumscribed possible erosion. This is nonspecific but can be seen with inflammatory arthropathy. Electronically Signed   By: Tanda Lyons M.D.   On: 10/22/2023 10:22    Patient was placed into a well-formed molded Exos cast right wrist.   Assessment and Plan: 68 y.o. female with right distal radius fracture with good alignment.  Fracture occurred about 3 weeks ago.  She has been treating herself with a compression sleeve.  X-ray obtained at PCP office yesterday showed a nondisplaced distal radius fracture.  Plan to treat with immobilization with a Exos cast and recheck in about 3 weeks.  On a more global issue she has pretty severe osteoporosis with a T-score of -3.7 in 2021.  She had initial trial of Fosamax but did not tolerate it and is not currently taking any medication.  She generally is opposed to medications for this issue.  Will go ahead and check vitamin D .  Recommend osteostrong when able.  If she is given taking medicine Tymlos is probably her best option.  I gave her handout about that we talked about it a little bit too.   PDMP not reviewed this encounter. Orders Placed This Encounter  Procedures   VITAMIN D  25 Hydroxy (Vit-D Deficiency, Fractures)    Standing Status:   Future    Number of Occurrences:   1    Expiration Date:   10/22/2024   No orders of the defined types were placed in this encounter.    Discussed warning signs or symptoms. Please see discharge instructions. Patient  expresses understanding.   The above documentation has been reviewed and is accurate and complete Artist Lloyd, M.D.

## 2023-10-22 NOTE — Progress Notes (Signed)
Subjective:    Patient ID: Leslie Cooper, female    DOB: 03-16-1956, 68 y.o.   MRN: [id number]      HPI Leslie Cooper is here for  Chief Complaint  Patient presents with   Wrist Injury    Patient fell back when we had ice helping with generator    Jan 10th  - she fell on ice and landed on right wrist.  The wrist was extended.  There was pain.  There was some swelling.  No bruising.  No N/T.  She iced it.  She could not use it.  She wore a brace for a week.   Pain has gotten better, but still has pain.  She was not sure if it was normal for her to still have pain.   Medications and allergies reviewed with patient and updated if appropriate.  Current Outpatient Medications on File Prior to Visit  Medication Sig Dispense Refill   Cholecalciferol (VITAMIN D3) 1000 UNITS CAPS Take by mouth daily.       Multiple Vitamins-Minerals (CENTRUM SILVER PO) Take by mouth daily.       No current facility-administered medications on file prior to visit.    Review of Systems     Objective:   Vitals:   10/22/23 0850  BP: 114/72  Pulse: (!) 57  Temp: 98.1 F (36.7 C)  SpO2: 100%   BP Readings from Last 3 Encounters:  10/22/23 114/72  07/05/23 112/62  07/12/22 118/68   Wt Readings from Last 3 Encounters:  10/22/23 110 lb (49.9 kg)  07/05/23 104 lb 9.6 oz (47.4 kg)  07/12/22 108 lb 12.8 oz (49.4 kg)   Body mass index is 16.73 kg/m.    Physical Exam Constitutional:      General: She is not in acute distress.    Appearance: Normal appearance. She is not ill-appearing.  HENT:     Head: Normocephalic and atraumatic.  Musculoskeletal:     Comments: Slight swelling anterior lateral wrist and there is tenderness with palpation.  Slight decrease extension.  Normal flexion.  Neurovascularly intact  Skin:    General: Skin is warm and dry.     Findings: No bruising, erythema or rash.  Neurological:     Mental Status: She is alert.     Sensory: No sensory deficit.      Motor: No weakness.        DG Wrist Complete Right CLINICAL DATA:  Fall 09/28/2023.  Right wrist and hand pain.  EXAM: RIGHT HAND - COMPLETE 3+ VIEW; RIGHT WRIST - COMPLETE 3+ VIEW  COMPARISON:  None Available.  FINDINGS: There is diffuse decreased bone mineralization.  Right wrist:  There is oblique linear lucency within the distal radius extending from the lateral cortex of the distal radial metaphysis distally and medial/ulnar early to the mid to slightly lateral aspect of the distal radial articular surface. Up to approximately 2 mm craniocaudal diastasis at the lateral aspect of the fracture without diastasis at the medial aspect of the fracture. Mild peripheral healing fracture line sclerosis and early bone resorption. Minimal thumb metacarpal degenerative spurring. Severe triscaphe joint space narrowing bone-on-bone contact and subchondral sclerosis.  Right hand:  Minimal distal lateral phalangeal degenerative spurring. Oblique view there is a 2 mm oval lucency within the dorsomedial aspect of the distal portion of the middle phalanx of the index finger, and well-circumscribed possible erosion.  IMPRESSION: 1. Subacute healing intra-articular fracture of the distal radius. Up to 2 mm diastasis at  the lateral distal radial metaphyseal cortex but otherwise no significant displacement. 2. Severe triscaphe osteoarthritis. 3. There is a 2 mm oval lucency within the dorsomedial aspect of the distal portion of the middle phalanx of the index finger, and well-circumscribed possible erosion. This is nonspecific but can be seen with inflammatory arthropathy.  Electronically Signed   By: Neita Garnet M.D.   On: 10/22/2023 10:22 DG Hand Complete Right CLINICAL DATA:  Fall 09/28/2023.  Right wrist and hand pain.  EXAM: RIGHT HAND - COMPLETE 3+ VIEW; RIGHT WRIST - COMPLETE 3+ VIEW  COMPARISON:  None Available.  FINDINGS: There is diffuse decreased bone  mineralization.  Right wrist:  There is oblique linear lucency within the distal radius extending from the lateral cortex of the distal radial metaphysis distally and medial/ulnar early to the mid to slightly lateral aspect of the distal radial articular surface. Up to approximately 2 mm craniocaudal diastasis at the lateral aspect of the fracture without diastasis at the medial aspect of the fracture. Mild peripheral healing fracture line sclerosis and early bone resorption. Minimal thumb metacarpal degenerative spurring. Severe triscaphe joint space narrowing bone-on-bone contact and subchondral sclerosis.  Right hand:  Minimal distal lateral phalangeal degenerative spurring. Oblique view there is a 2 mm oval lucency within the dorsomedial aspect of the distal portion of the middle phalanx of the index finger, and well-circumscribed possible erosion.  IMPRESSION: 1. Subacute healing intra-articular fracture of the distal radius. Up to 2 mm diastasis at the lateral distal radial metaphyseal cortex but otherwise no significant displacement. 2. Severe triscaphe osteoarthritis. 3. There is a 2 mm oval lucency within the dorsomedial aspect of the distal portion of the middle phalanx of the index finger, and well-circumscribed possible erosion. This is nonspecific but can be seen with inflammatory arthropathy.  Electronically Signed   By: Neita Garnet M.D.   On: 10/22/2023 10:22      Assessment & Plan:    Right wrist pain: Acute She fell 1/10 on ice on her extended right wrist. Had pain and swelling, but no bruising, numbness/tingling or weakness Wore a brace for a week Pain has improved, but still having pain and she was not sure if this was normal or not Neurovascularly intact Will get x-ray to evaluate for possible fracture/healing Discussed she may also have a ligament or tendon injury which may take several weeks to heal Okay to use brace, take Tylenol, or  ibuprofen as needed Follow-up depending on x-ray   X-ray above-advised her to wear her brace is much as possible Referral to sports medicine help follow-up and monitor healing of the fracture

## 2023-10-22 NOTE — Patient Instructions (Addendum)
      Xrays ordered    Medications changes include :   None     Return if symptoms worsen or fail to improve.

## 2023-10-23 ENCOUNTER — Ambulatory Visit: Payer: Medicare Other | Admitting: Family Medicine

## 2023-10-23 VITALS — BP 114/72 | HR 57 | Ht 68.0 in | Wt 110.0 lb

## 2023-10-23 DIAGNOSIS — M8000XA Age-related osteoporosis with current pathological fracture, unspecified site, initial encounter for fracture: Secondary | ICD-10-CM | POA: Diagnosis not present

## 2023-10-23 DIAGNOSIS — S52591A Other fractures of lower end of right radius, initial encounter for closed fracture: Secondary | ICD-10-CM

## 2023-10-23 NOTE — Patient Instructions (Addendum)
Thank you for coming in today.   Read about Tymlos  Recheck in about 3 weeks.   Look into Osteostrong and Tymlos.   Please get labs today before you leave

## 2023-10-24 ENCOUNTER — Encounter: Payer: Self-pay | Admitting: Family Medicine

## 2023-10-24 LAB — VITAMIN D 25 HYDROXY (VIT D DEFICIENCY, FRACTURES): Vit D, 25-Hydroxy: 110 ng/mL — ABNORMAL HIGH (ref 30–100)

## 2023-10-24 NOTE — Progress Notes (Signed)
Your vitamin D level is significantly elevated at 110.  It should be more like 40 or 50.  You can stop completely vitamin D for about a month then take half of what you are currently taking.

## 2023-11-12 NOTE — Progress Notes (Unsigned)
   Rubin Payor, PhD, LAT, ATC acting as a scribe for Clementeen Graham, MD.  Leslie Cooper is a 68 y.o. female who presents to [facility] at [facility] today for f/u R distal radius fx. Pt was last seen by Dr. Denyse Amass on 10/23/23 and was placed in an Exos cast and they discussed her severe osteoporosis. Based on lab findings she was advised to stop taking vitamin D for a month.  Today, pt reports ***  Dx testing. 10/23/23 Labs 10/22/23 R wrist & R hand XR 12/16/19 DEXA scan   Pertinent review of systems: ***  Relevant historical information: ***   Exam:  There were no vitals taken for this visit. General: Well Developed, well nourished, and in no acute distress.   MSK: ***    Lab and Radiology Results No results found for this or any previous visit (from the past 72 hours). No results found.     Assessment and Plan: 68 y.o. female with ***   PDMP not reviewed this encounter. No orders of the defined types were placed in this encounter.  No orders of the defined types were placed in this encounter.    Discussed warning signs or symptoms. Please see discharge instructions. Patient expresses understanding.   ***

## 2023-11-13 ENCOUNTER — Ambulatory Visit: Payer: Medicare Other | Admitting: Family Medicine

## 2023-11-13 ENCOUNTER — Encounter: Payer: Self-pay | Admitting: Family Medicine

## 2023-11-13 ENCOUNTER — Ambulatory Visit (INDEPENDENT_AMBULATORY_CARE_PROVIDER_SITE_OTHER): Payer: Medicare Other

## 2023-11-13 VITALS — BP 130/82 | HR 98 | Ht 68.0 in | Wt 107.0 lb

## 2023-11-13 DIAGNOSIS — S52531D Colles' fracture of right radius, subsequent encounter for closed fracture with routine healing: Secondary | ICD-10-CM

## 2023-11-13 DIAGNOSIS — S52591A Other fractures of lower end of right radius, initial encounter for closed fracture: Secondary | ICD-10-CM

## 2023-11-13 DIAGNOSIS — M8000XD Age-related osteoporosis with current pathological fracture, unspecified site, subsequent encounter for fracture with routine healing: Secondary | ICD-10-CM

## 2023-11-13 NOTE — Patient Instructions (Addendum)
 Thank you for coming in today.   Continue OsteoStrong  Check back in 1 month

## 2023-11-13 NOTE — Progress Notes (Signed)
 X-ray shows that fracture is healing.

## 2023-12-10 NOTE — Progress Notes (Unsigned)
   Rubin Payor, PhD, LAT, ATC acting as a scribe for Clementeen Graham, MD.  Leslie Cooper is a 68 y.o. female who presents to [facility] at [facility] today for f/u R distal radius fx. Pt was last seen by Dr. Denyse Amass on 11/13/23 and her Exos cast was slightly modified to reduce frictions and she wanted to try OseoStrong for her osteoporosis.  Today, pt reports ***  Dx testing. 11/13/23 R wrist XR 10/23/23 Labs 10/22/23 R wrist & R hand XR 12/16/19 DEXA scan   Pertinent review of systems: ***  Relevant historical information: ***   Exam:  There were no vitals taken for this visit. General: Well Developed, well nourished, and in no acute distress.   MSK: ***    Lab and Radiology Results No results found for this or any previous visit (from the past 72 hours). No results found.     Assessment and Plan: 68 y.o. female with ***   PDMP not reviewed this encounter. No orders of the defined types were placed in this encounter.  No orders of the defined types were placed in this encounter.    Discussed warning signs or symptoms. Please see discharge instructions. Patient expresses understanding.   ***

## 2023-12-11 ENCOUNTER — Ambulatory Visit: Payer: Medicare Other | Admitting: Family Medicine

## 2023-12-11 ENCOUNTER — Ambulatory Visit (INDEPENDENT_AMBULATORY_CARE_PROVIDER_SITE_OTHER)

## 2023-12-11 ENCOUNTER — Encounter: Payer: Self-pay | Admitting: Family Medicine

## 2023-12-11 VITALS — BP 122/84 | HR 51 | Ht 68.0 in | Wt 109.0 lb

## 2023-12-11 DIAGNOSIS — S52531D Colles' fracture of right radius, subsequent encounter for closed fracture with routine healing: Secondary | ICD-10-CM

## 2023-12-11 NOTE — Patient Instructions (Signed)
 Thank you for coming in today.   Recheck in 1 month.   OK to use a Velcro brace with a metal bar to hold the wrist still.   OK to have the brace off in the evening at home and get that hand moving a little.

## 2023-12-12 ENCOUNTER — Encounter: Payer: Self-pay | Admitting: Family Medicine

## 2023-12-12 NOTE — Progress Notes (Signed)
Right wrist x-ray shows healing fracture.

## 2024-01-14 ENCOUNTER — Encounter: Payer: Self-pay | Admitting: Family Medicine

## 2024-01-14 ENCOUNTER — Ambulatory Visit (INDEPENDENT_AMBULATORY_CARE_PROVIDER_SITE_OTHER)

## 2024-01-14 ENCOUNTER — Ambulatory Visit: Admitting: Family Medicine

## 2024-01-14 VITALS — BP 118/70 | HR 61 | Ht 68.0 in | Wt 109.0 lb

## 2024-01-14 DIAGNOSIS — S52531D Colles' fracture of right radius, subsequent encounter for closed fracture with routine healing: Secondary | ICD-10-CM

## 2024-01-14 DIAGNOSIS — M8000XD Age-related osteoporosis with current pathological fracture, unspecified site, subsequent encounter for fracture with routine healing: Secondary | ICD-10-CM | POA: Diagnosis not present

## 2024-01-14 DIAGNOSIS — M65331 Trigger finger, right middle finger: Secondary | ICD-10-CM | POA: Diagnosis not present

## 2024-01-14 DIAGNOSIS — S52531A Colles' fracture of right radius, initial encounter for closed fracture: Secondary | ICD-10-CM | POA: Insufficient documentation

## 2024-01-14 NOTE — Patient Instructions (Addendum)
 Thank you for coming in today.   Home Exercise Program  Can try Occupational Therapy is symptoms aren't improving.   Please use Voltaren gel (Generic Diclofenac Gel) up to 4x daily for pain as needed.  This is available over-the-counter as both the name brand Voltaren gel and the generic diclofenac gel.   Use Double Band-Aid Splint as shown in the office.   Bone Density (DEXA) Scan has been ordered, you can schedule this on your way out of the office.   OK to come out of the brace.   OK to do light weight lifting.

## 2024-01-14 NOTE — Progress Notes (Signed)
   I, Miquel Amen, CMA acting as a scribe for Garlan Juniper, MD.  Leslie Cooper is a 68 y.o. female who presents to [facility] at [facility] today for 87-month f/u R distal radius fx. Pt was last seen by Dr. Alease Hunter on 12/11/23 and was advised to cont immobilization, switching to a Velcro wrist brace.  Today, pt reports improvement of sx over the past week. Has been out of the brace more often over the past week. Denies new injury or swelling. Some pain with wrist flexion.   Patient notes a triggering sensation especially in her right third digit especially with flexion.  She feels a pop sensation in the palm of her hand around the MCP.  Dx testing: 12/11/23 R wrist XR 11/13/23 R wrist XR 10/23/23 Labs 10/22/23 R wrist & R hand XR 12/16/19 DEXA scan   Pertinent review of systems: No fevers or chills  Relevant historical information: Osteoporosis.  Decreased BMI.   Exam:  BP 118/70   Pulse 61   Ht 5\' 8"  (1.727 m)   Wt 109 lb (49.4 kg)   SpO2 98%   BMI 16.57 kg/m  General: Well Developed, well nourished, and in no acute distress.   MSK: Right hand and wrist some degenerative changes in the hand otherwise normal-appearing. Wrist is nontender to palpation. Slight decreased wrist motion. No triggering palpated with hand motion.   Lab and Radiology Results  X-ray images right wrist obtained today personally and independently interpreted. Fracture line at distal radius is not very visible today.  Healing near complete. DJD at STT joint stable. Await formal radiology review.     Assessment and Plan: 68 y.o. female with right wrist fracture ongoing for about 3 months now.  Doing pretty well.  Okay to discontinue immobilization and start home exercise program.  Okay to start reincorporating weight lifting and resistance training and osteo strong for osteoporosis. If range of motion is not improving consider formal occupational therapy.  Trigger finger: I do not  see much trigger finger on exam today but her description of what happens is very consistent with trigger finger.  Plan for Voltaren gel and double Band-Aid splint if not better consider injection.  We also talked about her osteoporosis.  Her last DEXA scan was in 2021.  Will update bone density test now so we can get a baseline prior to starting osteo strong treatment.  PDMP not reviewed this encounter. Orders Placed This Encounter  Procedures   DG Wrist Complete Right    Standing Status:   Future    Number of Occurrences:   1    Expiration Date:   02/13/2024    Reason for Exam (SYMPTOM  OR DIAGNOSIS REQUIRED):   right distal radius fx    Preferred imaging location?:   [facility]   DG BONE DENSITY (DXA)    Standing Status:   Future    Expiration Date:   01/13/2025    Reason for Exam (SYMPTOM  OR DIAGNOSIS REQUIRED):   eval bone density    Preferred imaging location?:   [address]   No orders of the defined types were placed in this encounter.    Discussed warning signs or symptoms. Please see discharge instructions. Patient expresses understanding.   The above documentation has been reviewed and is accurate and complete Garlan Juniper, M.D.

## 2024-01-15 NOTE — Progress Notes (Signed)
 Right wrist x-ray shows healing fracture.  Okay to start moving your wrist around like we talked about in clinic.  If not better consider occupational therapy.

## 2024-01-21 ENCOUNTER — Ambulatory Visit (INDEPENDENT_AMBULATORY_CARE_PROVIDER_SITE_OTHER)
Admission: RE | Admit: 2024-01-21 | Discharge: 2024-01-21 | Disposition: A | Discharge status: Home / Self Care | Source: Ambulatory Visit | Attending: Internal Medicine | Admitting: Internal Medicine

## 2024-01-21 DIAGNOSIS — M8000XD Age-related osteoporosis with current pathological fracture, unspecified site, subsequent encounter for fracture with routine healing: Secondary | ICD-10-CM

## 2024-01-21 NOTE — Progress Notes (Signed)
 DEXA scan shows a T-score of -4.0 in your low back.  This is quite low.  My understanding is working to try osteo strong before considering medications.

## 2024-05-27 ENCOUNTER — Ambulatory Visit (INDEPENDENT_AMBULATORY_CARE_PROVIDER_SITE_OTHER): Admitting: Internal Medicine

## 2024-05-27 ENCOUNTER — Ambulatory Visit (INDEPENDENT_AMBULATORY_CARE_PROVIDER_SITE_OTHER)

## 2024-05-27 ENCOUNTER — Telehealth: Payer: Self-pay | Admitting: Radiology

## 2024-05-27 ENCOUNTER — Encounter: Payer: Self-pay | Admitting: Internal Medicine

## 2024-05-27 ENCOUNTER — Ambulatory Visit: Payer: Self-pay | Admitting: Internal Medicine

## 2024-05-27 VITALS — BP 136/74 | HR 52 | Temp 97.7°F | Resp 16 | Ht 68.0 in | Wt 110.6 lb

## 2024-05-27 VITALS — BP 136/74 | HR 76 | Ht 66.5 in | Wt 110.0 lb

## 2024-05-27 DIAGNOSIS — E673 Hypervitaminosis D: Secondary | ICD-10-CM

## 2024-05-27 DIAGNOSIS — M810 Age-related osteoporosis without current pathological fracture: Secondary | ICD-10-CM | POA: Diagnosis not present

## 2024-05-27 DIAGNOSIS — Z0000 Encounter for general adult medical examination without abnormal findings: Secondary | ICD-10-CM | POA: Diagnosis not present

## 2024-05-27 DIAGNOSIS — R001 Bradycardia, unspecified: Secondary | ICD-10-CM | POA: Diagnosis not present

## 2024-05-27 LAB — BASIC METABOLIC PANEL WITH GFR
BUN: 12 mg/dL (ref 6–23)
CO2: 32 meq/L (ref 19–32)
Calcium: 10.2 mg/dL (ref 8.4–10.5)
Chloride: 102 meq/L (ref 96–112)
Creatinine, Ser: 0.77 mg/dL (ref 0.40–1.20)
GFR: 79.07 mL/min (ref >60.00)
Glucose, Bld: 88 mg/dL (ref 70–99)
Potassium: 3.9 meq/L (ref 3.5–5.1)
Sodium: 141 meq/L (ref 135–145)

## 2024-05-27 LAB — HEPATIC FUNCTION PANEL
ALT: 22 U/L (ref 0–35)
AST: 25 U/L (ref 0–37)
Albumin: 4.5 g/dL (ref 3.5–5.2)
Alkaline Phosphatase: 79 U/L (ref 39–117)
Bilirubin, Direct: 0.2 mg/dL (ref 0.0–0.3)
Total Bilirubin: 0.8 mg/dL (ref 0.2–1.2)
Total Protein: 6.9 g/dL (ref 6.0–8.3)

## 2024-05-27 LAB — PHOSPHORUS: Phosphorus: 4 mg/dL (ref 2.3–4.6)

## 2024-05-27 LAB — VITAMIN D 25 HYDROXY (VIT D DEFICIENCY, FRACTURES): VITD: 118.76 ng/mL (ref 30.00–100.00)

## 2024-05-27 LAB — TSH: TSH: 1.78 u[IU]/mL (ref 0.35–5.50)

## 2024-05-27 NOTE — Telephone Encounter (Signed)
 CRITICAL VALUE STICKER  CRITICAL VALUE: Vitamin D  118.76  RECEIVER (on-site recipient of call): Leslie Cooper   DATE & TIME NOTIFIED:   MESSENGER (representative from lab):  MD NOTIFIED: yes   TIME OF NOTIFICATION: 05/27/24 15:33  RESPONSE:

## 2024-05-27 NOTE — Progress Notes (Signed)
 Subjective:  Patient ID: Leslie Cooper, female    DOB: July 01, 1956  Age: 68 y.o. MRN: [id number]  CC: Bradycardia   HPI Leslie Cooper presents for f/up ----  Discussed the use of AI scribe software for clinical note transcription with the patient, who gave verbal consent to proceed.  History of Present Illness Leslie Cooper is a 68 year old female who presents with low heart rate.  Her heart rate is typically in the low sixties, according to the patient. She experiences no symptoms of weakness, dizziness, lightheadedness, or syncope and does not find the low heart rate bothersome.  She has a history of osteoporosis with previous bone fractures. She is not currently treating it and has no desire to pursue available treatments such as oral medications or injections.  She mentions weight loss, which she attributes to muscle loss due to not working out for a year while caring for her parents. She is trying to work out more recently.  She had a mammogram last year and is scheduled for another one at the end of this month at [facility].     Outpatient Medications Prior to Visit  Medication Sig Dispense Refill   Cholecalciferol (VITAMIN D3) 1000 UNITS CAPS Take by mouth daily.       Multiple Vitamins-Minerals (CENTRUM SILVER PO) Take by mouth daily.       No facility-administered medications prior to visit.    ROS Review of Systems  Constitutional:  Negative for appetite change, chills, diaphoresis, fatigue and fever.  HENT: Negative.    Eyes: Negative.   Respiratory: Negative.  Negative for cough, chest tightness, shortness of breath and wheezing.   Cardiovascular:  Negative for chest pain, palpitations and leg swelling.  Gastrointestinal: Negative.  Negative for abdominal pain, constipation, diarrhea, nausea and vomiting.  Endocrine: Negative.   Genitourinary: Negative.  Negative for difficulty urinating.  Musculoskeletal: Negative.    Skin: Negative.   Neurological:  Negative for dizziness, syncope, weakness and light-headedness.  Hematological:  Negative for adenopathy. Does not bruise/bleed easily.  Psychiatric/Behavioral: Negative.      Objective:  BP 136/74 (BP Location: Left Arm, Patient Position: Sitting, Cuff Size: Normal)   Pulse (!) 52   Temp 97.7 F (36.5 C) (Oral)   Resp 16   Ht 5' 8 (1.727 m)   Wt 110 lb 9.6 oz (50.2 kg)   SpO2 94%   BMI 16.82 kg/m   BP Readings from Last 3 Encounters:  05/27/24 136/74  05/27/24 136/74  01/14/24 118/70    Wt Readings from Last 3 Encounters:  05/27/24 110 lb (49.9 kg)  05/27/24 110 lb 9.6 oz (50.2 kg)  01/14/24 109 lb (49.4 kg)    Physical Exam Vitals reviewed.  Constitutional:      Appearance: Normal appearance.  HENT:     Nose: Nose normal.     Mouth/Throat:     Mouth: Mucous membranes are moist.  Eyes:     General: No scleral icterus.    Conjunctiva/sclera: Conjunctivae normal.  Cardiovascular:     Rate and Rhythm: Regular rhythm. Bradycardia present.     Heart sounds: No murmur heard.    No friction rub. No gallop.     Comments: EKG--- SB, 54 bpm (new) ++artifact No LVH, Q waves, or ST/T wave changes  Pulmonary:     Effort: Pulmonary effort is normal.     Breath sounds: No stridor. No wheezing, rhonchi or rales.  Abdominal:  General: Abdomen is flat.     Palpations: There is no mass.     Tenderness: There is no abdominal tenderness. There is no guarding.     Hernia: No hernia is present.  Musculoskeletal:     Right lower leg: No edema.     Left lower leg: No edema.  Skin:    General: Skin is warm and dry.  Neurological:     General: No focal deficit present.     Mental Status: She is alert.  Psychiatric:        Mood and Affect: Mood normal.        Behavior: Behavior normal.     Lab Results  Component Value Date   WBC 4.9 07/11/2023   HGB 14.2 07/11/2023   HCT 43.4 07/11/2023   PLT 242.0 07/11/2023   GLUCOSE 88  05/27/2024   CHOL 187 07/11/2023   TRIG 63.0 07/11/2023   HDL 83.00 07/11/2023   LDLCALC 92 07/11/2023   ALT 22 05/27/2024   AST 25 05/27/2024   NA 141 05/27/2024   K 3.9 05/27/2024   CL 102 05/27/2024   CREATININE 0.77 05/27/2024   BUN 12 05/27/2024   CO2 32 05/27/2024   TSH 1.78 05/27/2024    DG BONE DENSITY (DXA) Result Date: 01/21/2024 Table formatting from the original result was not included. Date of study: 01/21/2024 Exam: DUAL X-RAY ABSORPTIOMETRY (DXA) FOR BONE MINERAL DENSITY (BMD) Instrument: [employer] Requesting Provider: Dr. Joane Indication: Follow-up for osteoporosis Comparison: none (please note that it is not possible to compare data from different instruments) Clinical data: Pt is a 68 y.o. female without history of fracture.  On calcium and vitamin D . Results:  Lumbar spine L1-L4 Femoral neck (FN) 33% distal radius T-score -4.0 RFN: -2.6 LFN: -2.8 n/a Assessment: Patient has OSTEOPOROSIS according to the [facility] classification for osteoporosis (see below). Fracture risk: high Comments: the technical quality of the study is good Evaluation for secondary causes should be considered if clinically indicated. Recommend optimizing calcium (1200 mg/day) and vitamin D  (800 IU/day). Treatment is indicated. Followup: Repeat BMD is appropriate after 1-2 years. WHO criteria for diagnosis of osteoporosis in postmenopausal women and in men 49 y/o or older: - normal: T-score -1.0 to + 1.0 - osteopenia/low bone density: T-score between -2.5 and -1.0 - osteoporosis: T-score below -2.5 - severe osteoporosis: T-score below -2.5 with history of fragility fracture Note: although not part of the WHO classification, the presence of a fragility fracture, regardless of the T-score, should be considered diagnostic of osteoporosis, provided other causes for the fracture have been excluded. Treatment: The National Osteoporosis Foundation recommends that treatment be considered in postmenopausal women and  men age 46 or older with: 1. Hip or vertebral (clinical or morphometric) fracture 2. T-score of - 2.5 or lower at the spine or hip 3. 10-year fracture probability by FRAX of at least 20% for a major osteoporotic fracture and 3% for a hip fracture Lela Fendt, MD [facility]    Assessment & Plan:   Osteoporosis without current pathological fracture, unspecified osteoporosis type- She is not willing to treat this. -     Phosphorus; Future -     Basic metabolic panel with GFR; Future -     VITAMIN D  25 Hydroxy (Vit-D Deficiency, Fractures); Future -     Hepatic function panel; Future  Bradycardia- She is asx with this. -     TSH; Future -     Hepatic function panel; Future -  EKG 12-Lead  Vitamin D  intoxication- She was asked to discontinue Vit D supplements.     Follow-up: Return in about 6 months (around 11/24/2024).  Debby Molt, MD

## 2024-05-27 NOTE — Telephone Encounter (Signed)
 Informed patient to STOP all vit D supplements. Patient understood

## 2024-05-27 NOTE — Patient Instructions (Signed)
 Bradycardia, Adult Bradycardia is a slower-than-normal heartbeat. A normal resting heart rate for an adult ranges from 60 to 100 beats per minute. With bradycardia, the resting heart rate is less than 60 beats per minute. Bradycardia can prevent enough oxygen  from reaching certain areas of your body when you are active. It can be serious if it keeps enough oxygen  from reaching your brain and other parts of your body. Bradycardia is not a problem for everyone. For some healthy adults, a slow resting heart rate is normal. What are the causes? This condition may be caused by: A problem with the heart, including: A problem with the heart's electrical system, such as a heart block. With a heart block, electrical signals between the chambers of the heart are partially or completely blocked, so they are not able to work as they should. A problem with the heart's natural pacemaker (sinus node). Heart disease. A heart attack. Heart damage. Lyme disease. A heart infection. A heart condition that is present at birth (congenital heart defect). Certain medicines that treat heart conditions. Certain conditions, such as hypothyroidism and obstructive sleep apnea. Problems with the balance of chemicals and other substances, like potassium, in the blood. Trauma. Radiation therapy. What increases the risk? You are more likely to develop this condition if you: Are age 30 or older. Have high blood pressure (hypertension), high cholesterol (hyperlipidemia), or diabetes. Drink heavily, use tobacco or nicotine products, or use drugs. What are the signs or symptoms? Symptoms of this condition include: Light-headedness. Feeling faint or fainting. Fatigue and weakness. Trouble with activity or exercise. Shortness of breath. Chest pain (angina). Drowsiness. Confusion. Dizziness. How is this diagnosed? This condition may be diagnosed based on: Your symptoms. Your medical history. A physical exam. During  the exam, your health care provider will listen to your heartbeat and check your pulse. To confirm the diagnosis, your health care provider may order tests, such as: Blood tests. An electrocardiogram (ECG). This test records the heart's electrical activity. The test can show how fast your heart is beating and whether the heartbeat is steady. A test in which you wear a portable device (event recorder or Holter monitor) to record your heart's electrical activity while you go about your day. An exercise test. How is this treated? Treatment for this condition depends on the cause of the condition and how severe your symptoms are. Treatment may involve: Treatment of the underlying condition. Changing your medicines or how much medicine you take. Having a small, battery-operated device called a pacemaker implanted under the skin. When bradycardia occurs, this device can be used to increase your heart rate and help your heart beat in a regular rhythm. Follow these instructions at home: Lifestyle Manage any health conditions that contribute to bradycardia as told by your health care provider. Follow a heart-healthy diet. A nutrition specialist (dietitian) can help educate you about healthy food options and changes. Follow an exercise program that is approved by your health care provider. Maintain a healthy weight. Try to reduce or manage your stress, such as with yoga or meditation. If you need help reducing stress, ask your health care provider. Do not use any products that contain nicotine or tobacco. These products include cigarettes, chewing tobacco, and vaping devices, such as e-cigarettes. If you need help quitting, ask your health care provider. Do not use illegal drugs. Alcohol  use If you drink alcohol : Limit how much you have to: 0-1 drink a day for women who are not pregnant. 0-2 drinks a day  for men. Know how much alcohol  is in a drink. In the [place] one drink equals one 12 oz bottle of  beer (355 mL), one 5 oz glass of wine (148 mL), or one 1 oz glass of hard liquor (44 mL). General instructions Take over-the-counter and prescription medicines only as told by your health care provider. Keep all follow-up visits. This is important. How is this prevented? In some cases, bradycardia may be prevented by: Treating underlying medical problems. Stopping behaviors or medicines that can trigger the condition. Contact a health care provider if: You feel light-headed or dizzy. You almost faint. You feel weak or are easily fatigued during physical activity. You experience confusion or have memory problems. Get help right away if: You faint. You have chest pains or an irregular heartbeat (palpitations). You have trouble breathing. These symptoms may represent a serious problem that is an emergency. Do not wait to see if the symptoms will go away. Get medical help right away. Call your local emergency services (911 in the [place] Do not drive yourself to the hospital. Summary Bradycardia is a slower-than-normal heartbeat. With bradycardia, the resting heart rate is less than 60 beats per minute. Treatment for this condition depends on the cause. Manage any health conditions that contribute to bradycardia as told by your health care provider. Do not use any products that contain nicotine or tobacco. These products include cigarettes, chewing tobacco, and vaping devices, such as e-cigarettes. Keep all follow-up visits. This is important. This information is not intended to replace advice given to you by your health care provider. Make sure you discuss any questions you have with your health care provider. Document Revised: 12/26/2020 Document Reviewed: 12/26/2020 Elsevier Patient Education  2024 [employer].

## 2024-05-27 NOTE — Progress Notes (Signed)
 Subjective:   Leslie Cooper is a 68 y.o. who presents for a Medicare Wellness preventive visit.  As a reminder, Annual Wellness Visits don't include a physical exam, and some assessments may be limited, especially if this visit is performed virtually. We may recommend an in-person follow-up visit with your provider if needed.  Visit Complete: In person  Persons Participating in Visit: Patient.  AWV Questionnaire: Yes: Patient Medicare AWV questionnaire was completed by the patient on 05/25/2024; I have confirmed that all information answered by patient is correct and no changes since this date.  Cardiac Risk Factors include: advanced age (>78men, >34 women)     Objective:    Today's Vitals   05/27/24 0935  BP: 136/74  Pulse: 76  Weight: 110 lb (49.9 kg)  Height: 5' 6.5 (1.689 m)   Body mass index is 17.49 kg/m.     05/27/2024    9:45 AM  Advanced Directives  Does Patient Have a Medical Advance Directive? No  Would patient like information on creating a medical advance directive? Yes (MAU/Ambulatory/Procedural Areas - Information given)    Current Medications (verified) Outpatient Encounter Medications as of 05/27/2024  Medication Sig   Cholecalciferol (VITAMIN D3) 1000 UNITS CAPS Take by mouth daily.     Multiple Vitamins-Minerals (CENTRUM SILVER PO) Take by mouth daily.     No facility-administered encounter medications on file as of 05/27/2024.    Allergies (verified) Sudafed [pseudoephedrine] and Thimerosal (thiomersal)   History: Past Medical History:  Diagnosis Date   Osteoporosis 2012   History reviewed. No pertinent surgical history. Family History  Problem Relation Age of Onset   Heart disease Neg Hx    Hypertension Neg Hx    Cancer Neg Hx    Social History   Socioeconomic History   Marital status: Married    Spouse name: Not on file   Number of children: Not on file   Years of education: Not on file   Highest education level: Associate  degree: occupational, Scientist, product/process development, or vocational program  Occupational History   Not on file  Tobacco Use   Smoking status: Never   Smokeless tobacco: Never  Substance and Sexual Activity   Alcohol use: Yes    Alcohol/week: 1.0 standard drink of alcohol    Types: 1 Glasses of wine per week   Drug use: No   Sexual activity: Yes    Birth control/protection: Post-menopausal  Other Topics Concern   Not on file  Social History Narrative   Caffienated drinks-yesSeat belt use often-yesRegular Exercise-yesSmoke alarm in the home-yes   Firearms/guns in the home-yes   History of physical abuse-no   Social Drivers of Health   Financial Resource Strain: Low Risk  (05/27/2024)   Overall Financial Resource Strain (CARDIA)    Difficulty of Paying Living Expenses: Not very hard  Food Insecurity: No Food Insecurity (05/27/2024)   Hunger Vital Sign    Worried About Running Out of Food in the Last Year: Never true    Ran Out of Food in the Last Year: Never true  Transportation Needs: No Transportation Needs (05/27/2024)   PRAPARE - Administrator, Civil Service (Medical): No    Lack of Transportation (Non-Medical): No  Physical Activity: Sufficiently Active (05/27/2024)   Exercise Vital Sign    Days of Exercise per Week: 4 days    Minutes of Exercise per Session: 60 min  Stress: No Stress Concern Present (05/27/2024)   [employer] of Occupational Health - Occupational  Stress Questionnaire    Feeling of Stress: Not at all  Social Connections: Moderately Integrated (05/27/2024)   Social Connection and Isolation Panel    Frequency of Communication with Friends and Family: More than three times a week    Frequency of Social Gatherings with Friends and Family: More than three times a week    Attends Religious Services: More than 4 times per year    Active Member of [employer] or Organizations: No    Attends Engineer, structural: Never    Marital Status: Married    Tobacco  Counseling Counseling given: Not Answered    Clinical Intake:  Pre-visit preparation completed: Yes  Pain : No/denies pain     BMI - recorded: 17.49 Nutritional Status: BMI <19  Underweight Nutritional Risks: None Diabetes: No  No results found for: HGBA1C   How often do you need to have someone help you when you read instructions, pamphlets, or other written materials from your doctor or pharmacy?: 1 - Never  Interpreter Needed?: No  Information entered by :: Verdie Saba, CMA   Activities of Daily Living     05/27/2024    9:47 AM 05/25/2024    7:23 AM  In your present state of health, do you have any difficulty performing the following activities:  Hearing? 0 0  Vision? 0 0  Difficulty concentrating or making decisions? 0 0  Walking or climbing stairs? 0 0  Dressing or bathing? 0 0  Doing errands, shopping? 0 0  Preparing Food and eating ? N N  Using the Toilet? N N  In the past six months, have you accidently leaked urine? N N  Do you have problems with loss of bowel control? N N  Managing your Medications? N N  Managing your Finances? N N  Housekeeping or managing your Housekeeping? N N    Patient Care Team: Joshua Debby CROME, MD as PCP - General (Internal Medicine) [facility] CROME, OD (Optometry) Latisha Medford, MD as Consulting Physician (Obstetrics and Gynecology)  I have updated your Care Teams any recent Medical Services you may have received from other providers in the past year.     Assessment:   This is a routine wellness examination for [place].  Hearing/Vision screen Hearing Screening - Comments:: Denies hearing difficulties   Vision Screening - Comments:: Wears contact lenses - up-to-date with eye exams with [facility]   Goals Addressed               This Visit's Progress     Patient Stated (pt-stated)        Patient stated she plans to restart exercising consistently       Depression Screen     05/27/2024    9:37 AM  10/22/2023    8:55 AM 07/05/2023    1:12 PM 04/03/2022   10:24 AM 03/30/2021    3:34 PM 12/11/2019    9:18 AM  PHQ 2/9 Scores  PHQ - 2 Score 0 0 0 0 0 0  PHQ- 9 Score 0   0  0    Fall Risk     05/27/2024    9:34 AM 05/25/2024    7:23 AM 10/22/2023    8:54 AM 07/05/2023    1:12 PM 07/12/2022    2:52 PM  Fall Risk   Falls in the past year? 1 1 1  0 0  Number falls in past yr: 0 0 0 0 0  Comment 1  Injury with Fall? 1 1 1  0 0  Comment right wrist      Risk for fall due to : History of fall(s)  No Fall Risks No Fall Risks No Fall Risks  Follow up Falls evaluation completed;Falls prevention discussed  Falls evaluation completed Falls evaluation completed     MEDICARE RISK AT HOME:  Medicare Risk at Home Any stairs in or around the home?: Yes If so, are there any without handrails?: No Home free of loose throw rugs in walkways, pet beds, electrical cords, etc?: Yes Adequate lighting in your home to reduce risk of falls?: Yes Life alert?: No Use of a cane, walker or w/c?: No Grab bars in the bathroom?: No Shower chair or bench in shower?: No Elevated toilet seat or a handicapped toilet?: No  TIMED UP AND GO:  Was the test performed?  No  Cognitive Function: 6CIT completed        05/27/2024    9:39 AM  6CIT Screen  What Year? 0 points  What month? 0 points  What time? 0 points  Count back from 20 0 points  Months in reverse 0 points  Repeat phrase 2 points  Total Score 2 points    Immunizations Immunization History  Administered Date(s) Administered   Tdap 09/19/2007, 12/11/2019    Screening Tests Health Maintenance  Topic Date Due   MAMMOGRAM  03/31/2023   Zoster Vaccines- Shingrix (1 of 2) 08/26/2024 (Originally 08/18/2005)   Influenza Vaccine  12/16/2024 (Originally 04/18/2024)   Pneumococcal Vaccine: 50+ Years (1 of 1 - PCV) 05/27/2025 (Originally 08/18/2005)   Medicare Annual Wellness (AWV)  05/27/2025   Fecal DNA (Cologuard)  07/11/2026   DTaP/Tdap/Td (3 -  Td or Tdap) 12/10/2029   DEXA SCAN  Completed   Hepatitis C Screening  Completed   HPV VACCINES  Aged Out   Meningococcal B Vaccine  Aged Out   Colonoscopy  Discontinued   COVID-19 Vaccine  Discontinued    Health Maintenance Items Addressed:  05/27/2024  Additional Screening:  Vision Screening: Recommended annual ophthalmology exams for early detection of glaucoma and other disorders of the eye. Is the patient up to date with their annual eye exam?  Yes  Who is the provider or what is the name of the office in which the patient attends annual eye exams? [facility]   Dental Screening: Recommended annual dental exams for proper oral hygiene  Community Resource Referral / Chronic Care Management: CRR required this visit?  No   CCM required this visit?  No   Plan:    I have personally reviewed and noted the following in the patient's chart:   Medical and social history Use of alcohol, tobacco or illicit drugs  Current medications and supplements including opioid prescriptions. Patient is not currently taking opioid prescriptions. Functional ability and status Nutritional status Physical activity Advanced directives List of other physicians Hospitalizations, surgeries, and ER visits in previous 12 months Vitals Screenings to include cognitive, depression, and falls Referrals and appointments  In addition, I have reviewed and discussed with patient certain preventive protocols, quality metrics, and best practice recommendations. A written personalized care plan for preventive services as well as general preventive health recommendations were provided to patient.   Verdie CHRISTELLA Saba, CMA   05/27/2024   After Visit Summary: (In Person-Declined) Patient declined AVS at this time.  Notes: Nothing significant to report at this time.

## 2024-05-27 NOTE — Patient Instructions (Signed)
 Leslie Cooper,  Thank you for taking the time for your Medicare Wellness Visit. I appreciate your continued commitment to your health goals. Please review the care plan we discussed, and feel free to reach out if I can assist you further.  Medicare recommends these wellness visits once per year to help you and your care team stay ahead of potential health issues. These visits are designed to focus on prevention, allowing your provider to concentrate on managing your acute and chronic conditions during your regular appointments.  Please note that Annual Wellness Visits do not include a physical exam. Some assessments may be limited, especially if the visit was conducted virtually. If needed, we may recommend a separate in-person follow-up with your provider.  Ongoing Care Seeing your primary care provider every 3 to 6 months helps us  monitor your health and provide consistent, personalized care.   Referrals If a referral was made during today's visit and you haven't received any updates within two weeks, please contact the referred provider directly to check on the status.  Recommended Screenings:  Health Maintenance  Topic Date Due   Mammogram  03/31/2023   Zoster (Shingles) Vaccine (1 of 2) 08/26/2024*   Flu Shot  12/16/2024*   Pneumococcal Vaccine for age over 49 (1 of 1 - PCV) 05/27/2025*   Medicare Annual Wellness Visit  05/27/2025   Cologuard (Stool DNA test)  07/11/2026   DTaP/Tdap/Td vaccine (3 - Td or Tdap) 12/10/2029   DEXA scan (bone density measurement)  Completed   Hepatitis C Screening  Completed   HPV Vaccine  Aged Out   Meningitis B Vaccine  Aged Out   Colon Cancer Screening  Discontinued   COVID-19 Vaccine  Discontinued  *Topic was postponed. The date shown is not the original due date.       05/27/2024    9:45 AM  Advanced Directives  Does Patient Have a Medical Advance Directive? No  Would patient like information on creating a medical advance directive? Yes  (MAU/Ambulatory/Procedural Areas - Information given)   Advance Care Planning is important because it: Ensures you receive medical care that aligns with your values, goals, and preferences. Provides guidance to your family and loved ones, reducing the emotional burden of decision-making during critical moments.  Vision: Annual vision screenings are recommended for early detection of glaucoma, cataracts, and diabetic retinopathy. These exams can also reveal signs of chronic conditions such as diabetes and high blood pressure.  Dental: Annual dental screenings help detect early signs of oral cancer, gum disease, and other conditions linked to overall health, including heart disease and diabetes.

## 2024-05-27 NOTE — Telephone Encounter (Signed)
 Ask her to STOP all Vit D supplements

## 2025-06-01 ENCOUNTER — Ambulatory Visit

## 2025-06-01 ENCOUNTER — Encounter: Admitting: Internal Medicine
